# Patient Record
Sex: Male | Born: 1965 | Race: Black or African American | Hispanic: No | Marital: Married | Smoking: Never smoker
Health system: Southern US, Community
[De-identification: ages and names within clinical notes are randomized; demographics above are authoritative.]

## PROBLEM LIST (undated history)

## (undated) DIAGNOSIS — L0291 Cutaneous abscess, unspecified: Secondary | ICD-10-CM

## (undated) DIAGNOSIS — N189 Chronic kidney disease, unspecified: Secondary | ICD-10-CM

## (undated) DIAGNOSIS — E1129 Type 2 diabetes mellitus with other diabetic kidney complication: Secondary | ICD-10-CM

## (undated) DIAGNOSIS — R809 Proteinuria, unspecified: Secondary | ICD-10-CM

## (undated) DIAGNOSIS — I10 Essential (primary) hypertension: Secondary | ICD-10-CM

## (undated) HISTORY — DX: Type 2 diabetes mellitus with other diabetic kidney complication: E11.29

## (undated) HISTORY — DX: Proteinuria, unspecified: R80.9

## (undated) HISTORY — DX: Morbid (severe) obesity due to excess calories: E66.01

## (undated) HISTORY — DX: Cutaneous abscess, unspecified: L02.91

## (undated) HISTORY — DX: Chronic kidney disease, unspecified: N18.9

## (undated) HISTORY — DX: Essential (primary) hypertension: I10

---
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
## 2009-09-05 DIAGNOSIS — L0291 Cutaneous abscess, unspecified: Secondary | ICD-10-CM | POA: Insufficient documentation

## 2009-09-05 HISTORY — DX: Cutaneous abscess, unspecified: L02.91

## 2009-09-05 HISTORY — PX: OTHER SURGICAL HISTORY: SHX169

## 2009-09-30 ENCOUNTER — Inpatient Hospital Stay (HOSPITAL_COMMUNITY): Admission: EM | Admit: 2009-09-30 | Discharge: 2009-10-05 | Payer: Self-pay | Admitting: Internal Medicine

## 2009-09-30 ENCOUNTER — Ambulatory Visit: Payer: Self-pay | Admitting: Internal Medicine

## 2009-09-30 LAB — CONVERTED CEMR LAB
ALT: 28 units/L
AST: 23 units/L
Alkaline Phosphatase: 107 units/L
CO2: 24 meq/L
Creatinine, Ser: 4.52 mg/dL
Potassium: 4.3 meq/L
Sodium: 132 meq/L
Total Bilirubin: 0.4 mg/dL

## 2009-10-01 ENCOUNTER — Encounter: Payer: Self-pay | Admitting: Internal Medicine

## 2009-10-05 ENCOUNTER — Encounter: Payer: Self-pay | Admitting: Internal Medicine

## 2009-10-05 DIAGNOSIS — N189 Chronic kidney disease, unspecified: Secondary | ICD-10-CM

## 2009-10-05 DIAGNOSIS — L03319 Cellulitis of trunk, unspecified: Secondary | ICD-10-CM

## 2009-10-05 DIAGNOSIS — L02219 Cutaneous abscess of trunk, unspecified: Secondary | ICD-10-CM

## 2009-10-05 DIAGNOSIS — I10 Essential (primary) hypertension: Secondary | ICD-10-CM

## 2009-10-05 DIAGNOSIS — J45909 Unspecified asthma, uncomplicated: Secondary | ICD-10-CM | POA: Insufficient documentation

## 2009-10-05 DIAGNOSIS — E119 Type 2 diabetes mellitus without complications: Secondary | ICD-10-CM

## 2009-10-19 ENCOUNTER — Encounter: Payer: Self-pay | Admitting: Internal Medicine

## 2009-10-19 ENCOUNTER — Ambulatory Visit: Payer: Self-pay | Admitting: Internal Medicine

## 2009-10-19 ENCOUNTER — Encounter (INDEPENDENT_AMBULATORY_CARE_PROVIDER_SITE_OTHER): Payer: Self-pay | Admitting: Internal Medicine

## 2009-10-19 DIAGNOSIS — E669 Obesity, unspecified: Secondary | ICD-10-CM | POA: Insufficient documentation

## 2009-10-19 LAB — CONVERTED CEMR LAB
Basophils Absolute: 0.1 10*3/uL (ref 0.0–0.1)
CO2: 24 meq/L (ref 19–32)
Calcium: 9.1 mg/dL (ref 8.4–10.5)
Glucose, Bld: 334 mg/dL — ABNORMAL HIGH (ref 70–99)
Hemoglobin: 12.4 g/dL — ABNORMAL LOW (ref 13.0–17.0)
Lymphs Abs: 2.1 10*3/uL (ref 0.7–4.0)
Neutro Abs: 2.5 10*3/uL (ref 1.7–7.7)
Platelets: 319 10*3/uL (ref 150–400)
Potassium: 4.4 meq/L (ref 3.5–5.3)
RBC: 4.22 M/uL (ref 4.22–5.81)
RDW: 16.5 % — ABNORMAL HIGH (ref 11.5–15.5)
Sodium: 135 meq/L (ref 135–145)
WBC: 5.8 10*3/uL (ref 4.0–10.5)

## 2009-10-25 ENCOUNTER — Ambulatory Visit: Payer: Self-pay | Admitting: Internal Medicine

## 2009-10-25 LAB — CONVERTED CEMR LAB
Blood Glucose, Fingerstick: 292
Blood Glucose, Home Monitor: 1 mg/dL

## 2009-11-01 ENCOUNTER — Telehealth (INDEPENDENT_AMBULATORY_CARE_PROVIDER_SITE_OTHER): Payer: Self-pay | Admitting: *Deleted

## 2010-06-07 NOTE — Assessment & Plan Note (Signed)
Summary: DM TEACHING/CH   Vital Signs:  Patient profile:   45 year old male Weight:      410.8 pounds CBG Result 292 CBG Device ID [facility] Comments CBG today is fasting    Allergies: No Known Drug Allergies   Complete Medication List: 1)  Lantus 100 Unit/ml Soln (Insulin glargine) .... Inject 22 units subcutaneously at bedtime tonight and begin increasing by 2 units each night until fasting blood sugars are < 150mg /dl 2)  Lisinopril 5 Mg Tabs (Lisinopril) .... Take 1 tablet by mouth once a day 3)  Amlodipine Besylate 10 Mg Tabs (Amlodipine besylate) .... Take 1 tablet by mouth once a day 4)  Atenolol 25 Mg Tabs (Atenolol) .... Take 1 tablet by mouth once a day 5)  Ventolin Hfa 108 (90 Base) Mcg/act Aers (Albuterol sulfate) .Marland Kitchen.. 1 puff every 4 hours as needed for shortness of breath  Other Orders: DSMT(Medicare) Individual, 30 Minutes [id number])  Patient Instructions: 1)  get meter at [place] and strips 2)  Begin testing blood sugar every morning 3)  increase lantus by 2 units each night until mornig blood sugar is <150 mg/dl. 4)  More vegetables at meals need to buy some ( meats meat substitutes- like nuts,seeds, eggs, peanutbutter tuna salmon have no carbs) (Carbs increase blood sugar-  so try to limit portions ) 5)  ? Check with Marlana Latus to see if she can help you until insurance covers  Diabetes Self Management Training  Diabetes Type: Type 2 insulin Current smoking Status: never  Vital Signs Todays Weight: 410.8lb  in in-lbs   Assessment Type of Work: Company secretary- from [place]  Potential Barriers  Economic/Supplies  Diabetes Medications:  Comments: tkaing lantus as prescribed. Self testted his own blood sugar today on [employer] meter. Michela Pitcher he is insured, but diabetes is not covered for 1 year because ti is pre-existing. Caught on quiclly to self monitoirng and meal planning. Note high A1C in hospital- CBG very high fasring today- calcualted basal  insulin .25 units/kg =  ~ 46 units /day suggest he begin increasing by 2 units each night and also work on meal planning at same time-  emphasizing low carb foods and increase water.  Long Acting  Insulin Type:Lantus  Bedtime Dose: 20    Monitoring Self monitoring blood glucose 1 time a day Name of Meter  [facility]  Time of Testing  Before Breakfast  Recent Episodes of: Requiring Help from another person  Hyperglycemia : Yes Hypoglycemia: No Severe Hypoglycemia : No     Diabetes Disease Process  Discussed today Define diabetes in simple terms: Demonstrates competencyState own type of diabetes: Demonstrates competencyState diabetes is treated by meal plan-exercise-medication-monitoring-education: Demonstrates competency Medications State name-action-dose-duration-side effects-and time to take medication: Needs review/assistance   State appropriate timing of food related to medication: Demonstrates competency Nutritional Management Identify what foods most often affect blood glucose: No knowledge    Verbalize importance of controlling food portions: No knowledge   State importance of spacing and not omitting meals and snacks: No knowledge   State changes planned for home meals/snacks: Needs review/assistance    Monitoring State purpose and frequency of monitoring BG-ketones-HgbA1C  : No knowledge   Perform glucose monitoring/ketone testing and record results correctly: No knowledge    State target blood glucose and HgbA1C goals: No knowledge    Complications State the causes-signs and symptoms and prevention of Hyperglycemia: Needs review/assistance   Explain proper treatment of hyperglycemia: Needs review/assistance  Exercise Diabetes Management Education Done: 10/25/2009    BEHAVIORAL GOALS INITIAL Utilizing medications if for therapeutic effectiveness: self adjust insulin to decrease blood sugars Monitoring blood glucose levels daily: purchase meter and  begin chekcing blood sugars fasting        Suggested patient meet with Marlana Latus to see if he qualkifies foir assistance while wating for his insuramce to cover his meds,supplies & training.  Diabetes Self Management Support: clinic visits Follow-up:3-4 weeks  Barnabas Harries RD,CDE  October 25, 2009 1:25 PM

## 2010-06-07 NOTE — Miscellaneous (Signed)
Summary: PATIENT CONSENT  PATIENT CONSENT   Imported By: Lacy Duverney 11/02/2009 14:47:50  _____________________________________________________________________  External Attachment:    Type:   Image     Comment:   External Document  Appended Document:

## 2010-06-07 NOTE — Assessment & Plan Note (Signed)
Summary: NEW HFU-PER DR TOBBIA/CFB   Vital Signs:  Patient profile:   45 year old male Height:      70 inches Weight:      411.3 pounds BMI:     59.23 O2 Sat:      96 % on Room air Temp:     97.7 degrees F oral Pulse rate:   76 / minute BP sitting:   140 / 90  (right arm)  Vitals Entered By: Silverio Decamp NT II (October 19, 2009 10:04 AM)  O2 Flow:  Room air CC: HFU Is Patient Diabetic? Yes Did you bring your meter with you today? No Nutritional Status BMI of > 30 = obese  Have you ever been in a relationship where you felt threatened, hurt or afraid?No   Does patient need assistance? Functional Status Self care Ambulation Normal   Primary Care Provider:  None  CC:  HFU.  History of Present Illness: Patient is a 45 year old man with PMH of HTN, DM, CKD (former baseline SCr of 1.6 per hospital d/c summary) and obesity who presents for followup following hospitalization for abdominal wall abscess with MRSA that needed surgical I&D. Patient is completing course of doxycycline and is establishing care in the [facility]. He hasno complaints today, here for only for followup. I&D site healing well. Will schedule appointment with Dr. Philipp Ovens today for followup. Has been taking Lantus, 20 units at bedtime since discharge. Reports that average blood sugar checked with wife's meter is around 200-230 at night. No problems with self-administration. Had been on metformin for about 1 year prior to this past hospitalization. Has not been able to check blood pressure at home. Denies any more fever, chills, abdominal pain, headaches, weakness, nausea, or vomiting.  Preventive Screening-Counseling & Management  Alcohol-Tobacco     Smoking Status: never  Current Medications (verified): 1)  Lantus 100 Unit/ml Soln (Insulin Glargine) .... Inject 20 Units Subcutaneously At Bedtime 2)  Lisinopril 5 Mg Tabs (Lisinopril) .... Take 1 Tablet By Mouth Once A Day 3)  Amlodipine Besylate 10 Mg Tabs  (Amlodipine Besylate) .... Take 1 Tablet By Mouth Once A Day 4)  Atenolol 25 Mg Tabs (Atenolol) .... Take 1 Tablet By Mouth Once A Day 5)  Ventolin Hfa 108 (90 Base) Mcg/act Aers (Albuterol Sulfate) .Marland Kitchen.. 1 Puff Every 4 Hours As Needed For Shortness of Breath  Allergies (verified): No Known Drug Allergies  Past History:  Family History: Last updated: 10/19/2009 Father - asthma, HTN, deceased - natural causes? 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   Head:  normocephalic and atraumatic.   Eyes:  vision grossly intact, pupils equal, and pupils round.   Ears:  no external deformities.   Nose:  no external deformity, no external erythema, and no nasal discharge.   Mouth:  good dentition and pharynx pink and moist.   Neck:  supple, full ROM, and no masses.   Lungs:  normal respiratory effort, normal breath sounds, no crackles,  and no wheezes.   Heart:  normal rate, regular rhythm, no murmur, no gallop, no rub, and no JVD.  Distant sounds given body habitus, but no abnormalities noted on auscultation. Abdomen:  soft, non-tender, and normal bowel sounds. Incision and drainage site midline of lower abdomen without purulent discharge, well-cared for and packed appropriately. No guarding or rigidity. Obese abdomen. Neurologic:  alert & oriented X3, cranial nerves II-XII intact, and gait normal.   Skin:  turgor normal and color normal.   Psych:  Oriented X3, memory intact for recent and remote, normally interactive, good eye contact, not anxious appearing, and not depressed appearing.     Impression & Recommendations:  Problem # 1:  CELLULITIS AND ABSCESS OF TRUNK (ICD-682.2) Will recheck CBC as indicated for hospital f/u. Wound appears to be healing well without purulent discharge. Granulation tissue appears healthy and patient continues to pack and provide wound care as direct by Dr. Philipp Ovens. Has appointment now scheduled for June 20th with Dr. Philipp Ovens.  Orders: T-CBC w/Diff [id number])  Problem # 2:  CHRONIC KIDNEY DISEASE UNSPECIFIED (ICD-585.9) Wil repeat BMET today to evaluate Cr. Patient now off metformin and on Lantus given CKD. Per hospital d/c baseline in past was around 1.6, Cr was trending down at d/c, but never returned to 1.6 prior to discharge. May have to use new value as new baseline for patient.  Orders: T-Basic Metabolic Panel ([id number] Orders: T-Basic Metabolic Panel ([id number]) ... 10/20/2009  Problem # 3:  DIABETES MELLITUS, TYPE II (ICD-250.00) Will update medical record to reflect A1c obtained while hospitalized (12.7). Patient needs to obtain own diabetic testing supplies and would benefit from referral with Barnabas Harries for both DM teaching as well as medical nutrition therapy. Both referrals made. Patient states that he has had eyes checked in the past - he states that he will  make his own appointment and have the ophthalmologist/optometrist send [place] the report. Updated cholesterol studies from hospitalization and patient with direct LDL of 98. Will give patient time to see if lifestyle changes can decrease this, if not, may benefit from lipid lowering therapy and HDL <10, therefore medication such as niacin may be the best option if tolerated. Will recheck lipids at next appointment.  His updated medication list for this problem includes:    Lantus 100 Unit/ml Soln (Insulin glargine) ..... Inject 20 units subcutaneously at bedtime    Lisinopril 5 Mg Tabs (Lisinopril) .Marland Kitchen... Take 1 tablet by mouth once a day  Orders: DME Referral (DME)Future Orders: T-Lipid Profile [id number]) ... 10/20/2009  Problem # 4:  OBESITY (ICD-278.00) Referred for MNT. Discussed BMI goal of <30 and current BMI of 59. Discussed that portion control, exercise and perserverence will be key to success. Appears motivated and would like to meet for nutrition guidance.  Orders: Nutrition Referral (Nutrition)  Problem # 5:  HYPERTENSION, MILD (ICD-401.1) Repeated BP in room at 140/90. Patient appears to be on a  good regimen for his blood pressure, although HCTZ was likely not started while hospitalized secondary to prerenal acute on chronic kidney disease. Will consider adding HCTZ vs. increasing ACEi/beta-blocker if pressures are uncontrolled and kidney function is back to baseline. Will wait to make changes pending BMET results.  His updated medication list for this problem includes:    Lisinopril 5 Mg Tabs (Lisinopril) .Marland Kitchen... Take 1 tablet by mouth once a day    Amlodipine Besylate 10 Mg Tabs (Amlodipine besylate) .Marland Kitchen... Take 1 tablet by mouth once a day    Atenolol 25 Mg Tabs (Atenolol) .Marland Kitchen... Take 1 tablet by mouth once a day  Complete Medication List: 1)  Lantus 100 Unit/ml Soln (Insulin glargine) .... Inject 20 units subcutaneously at bedtime 2)  Lisinopril 5 Mg Tabs (Lisinopril) .... Take 1  tablet by mouth once a day 3)  Amlodipine Besylate 10 Mg Tabs (Amlodipine besylate) .... Take 1 tablet by mouth once a day 4)  Atenolol 25 Mg Tabs (Atenolol) .... Take 1 tablet by mouth once a day 5)  Ventolin Hfa 108 (90 Base) Mcg/act Aers (Albuterol sulfate) .Marland Kitchen.. 1 puff every 4 hours as needed for shortness of breath  Patient Instructions: 1)  Please schedule a follow-up appointment in 2 months. 2)  It is important that you exercise regularly at least 20 minutes 5 times a week. If you develop chest pain, have severe difficulty breathing, or feel very tired , stop exercising immediately and seek medical attention. Consider a lower calorie diet.  3)  Check your blood sugars regularly. If your readings are usually above 250 or below 70 you should contact our office. 4)  It is important that your Diabetic A1c level is checked every 3 months. 5)  See your eye doctor yearly to check for diabetic eye damage. 6)  Check your feet each night for sore areas, calluses or signs of infection. 7)  Check your Blood Pressure regularly. If it is above 150/100 you should make an appointment.  Prevention & Chronic Care Immunizations   Influenza vaccine: Not documented    Tetanus booster: Not documented   Tetanus booster due: 09/06/2019    Pneumococcal vaccine: Not documented  Other Screening   Smoking status: never  (10/19/2009)  Diabetes Mellitus   HgbA1C: 12.7  (10/01/2009)    Eye exam: Not documented    Foot exam: Not documented   High risk foot: Not documented   Foot care education: Not documented    Urine microalbumin/creatinine ratio: 131.4  (10/01/2009)    Diabetes flowsheet reviewed?: Yes   Progress toward A1C goal: Unchanged   Diabetes comments: Will make own Ophtho appt. Patient will have their office send records when he sees them  Lipids   Total Cholesterol: 143  (10/01/2009)   LDL: Not calculated  (10/01/2009)   LDL Direct: 98  (10/01/2009)   HDL: <10  (10/01/2009)    Triglycerides: 150  (10/01/2009)  Hypertension   Last Blood Pressure: 140 / 90  (10/19/2009)   Serum creatinine: 4.52  (09/30/2009)   Serum potassium 4.3  (09/30/2009)    Hypertension flowsheet reviewed?: Yes   Progress toward BP goal: Unchanged  Self-Management Support :    Diabetes self-management support: Education handout, Written self-care plan, Referred for DM self-management training  (10/19/2009)   Diabetes care plan printed   Diabetes education handout printed   Referred.    Hypertension self-management support: Education handout, Written self-care plan  (10/19/2009)   Hypertension self-care plan printed.   Hypertension education  handout printed   Nursing Instructions: Refer for diabetes self-management training (see order)    Process Orders Check Orders Results:     [facility]: ABN not required for this insurance Tests Sent for requisitioning (October 19, 2009 1:48 PM):     10/19/2009: [facility] -- T-CBC w/Diff [id number] (signed)     10/19/2009: [facility] -- T-Basic Metabolic Panel [id number] (signed)     10/20/2009: [facility] -- T-Basic Metabolic Panel 0000000 (signed)     10/20/2009: [facility] -- T-Lipid Profile [phone] (signed)   Diabetes Self Management Training Referral Patient Name: Brandon Lowery Date Of Birth: [birth year] MRN: [id number] Current Diagnosis:  OBESITY (ICD-278.00) CHRONIC KIDNEY DISEASE UNSPECIFIED (ICD-585.9) ASTHMA (ICD-493.90) HYPERTENSION, MILD (ICD-401.1) DIABETES MELLITUS, TYPE II (ICD-250.00) CELLULITIS AND ABSCESS OF TRUNK (ICD-682.2)     Management Training Needs:   Initial Diabetes Self management Training   Insulin Instruction  Monitoring  Initial Medical Nutrition Therapy(3 hours/1st year)

## 2010-06-07 NOTE — Progress Notes (Signed)
Summary: diabetes support/dmr  Phone Note Outgoing Call   Call placed by: Barnabas Harries RD,CDE,  November 01, 2009 9:26 AM Summary of Call: called to follow up on self titrtation of lantus- ? CBGs how much lantus is he injection now? left message for return call at his conenience.

## 2010-07-23 ENCOUNTER — Encounter: Payer: Self-pay | Admitting: Internal Medicine

## 2010-07-23 DIAGNOSIS — L0291 Cutaneous abscess, unspecified: Secondary | ICD-10-CM

## 2010-07-23 DIAGNOSIS — R809 Proteinuria, unspecified: Secondary | ICD-10-CM | POA: Insufficient documentation

## 2010-07-23 DIAGNOSIS — E1129 Type 2 diabetes mellitus with other diabetic kidney complication: Secondary | ICD-10-CM | POA: Insufficient documentation

## 2010-07-23 DIAGNOSIS — J45909 Unspecified asthma, uncomplicated: Secondary | ICD-10-CM | POA: Insufficient documentation

## 2010-07-23 DIAGNOSIS — N189 Chronic kidney disease, unspecified: Secondary | ICD-10-CM

## 2010-07-25 LAB — RENAL FUNCTION PANEL
BUN: 21 mg/dL (ref 6–23)
BUN: 44 mg/dL — ABNORMAL HIGH (ref 6–23)
CO2: 24 mEq/L (ref 19–32)
CO2: 25 mEq/L (ref 19–32)
Calcium: 8 mg/dL — ABNORMAL LOW (ref 8.4–10.5)
Chloride: 102 mEq/L (ref 96–112)
Chloride: 104 mEq/L (ref 96–112)
Creatinine, Ser: 2.38 mg/dL — ABNORMAL HIGH (ref 0.4–1.5)
Creatinine, Ser: 3.22 mg/dL — ABNORMAL HIGH (ref 0.4–1.5)
GFR calc Af Amer: 25 mL/min — ABNORMAL LOW (ref >60)
GFR calc Af Amer: 31 mL/min — ABNORMAL LOW (ref >60)
GFR calc non Af Amer: 26 mL/min — ABNORMAL LOW (ref >60)
Glucose, Bld: 184 mg/dL — ABNORMAL HIGH (ref 70–99)
Glucose, Bld: 187 mg/dL — ABNORMAL HIGH (ref 70–99)
Glucose, Bld: 210 mg/dL — ABNORMAL HIGH (ref 70–99)
Potassium: 4.2 mEq/L (ref 3.5–5.1)
Sodium: 135 mEq/L (ref 135–145)
Sodium: 136 mEq/L (ref 135–145)
Sodium: 137 mEq/L (ref 135–145)

## 2010-07-25 LAB — COMPREHENSIVE METABOLIC PANEL
ALT: 28 U/L (ref 0–53)
Albumin: 2.3 g/dL — ABNORMAL LOW (ref 3.5–5.2)
Alkaline Phosphatase: 107 U/L (ref 39–117)
BUN: 62 mg/dL — ABNORMAL HIGH (ref 6–23)
Calcium: 8.1 mg/dL — ABNORMAL LOW (ref 8.4–10.5)
Chloride: 97 mEq/L (ref 96–112)
Creatinine, Ser: 4.52 mg/dL — ABNORMAL HIGH (ref 0.4–1.5)
GFR calc non Af Amer: 14 mL/min — ABNORMAL LOW (ref >60)
Glucose, Bld: 233 mg/dL — ABNORMAL HIGH (ref 70–99)
Total Bilirubin: 0.4 mg/dL (ref 0.3–1.2)
Total Protein: 6.7 g/dL (ref 6.0–8.3)

## 2010-07-25 LAB — GLUCOSE, CAPILLARY
Glucose-Capillary: 112 mg/dL — ABNORMAL HIGH (ref 70–99)
Glucose-Capillary: 119 mg/dL — ABNORMAL HIGH (ref 70–99)
Glucose-Capillary: 140 mg/dL — ABNORMAL HIGH (ref 70–99)
Glucose-Capillary: 169 mg/dL — ABNORMAL HIGH (ref 70–99)
Glucose-Capillary: 207 mg/dL — ABNORMAL HIGH (ref 70–99)
Glucose-Capillary: 209 mg/dL — ABNORMAL HIGH (ref 70–99)
Glucose-Capillary: 215 mg/dL — ABNORMAL HIGH (ref 70–99)
Glucose-Capillary: 239 mg/dL — ABNORMAL HIGH (ref 70–99)
Glucose-Capillary: 242 mg/dL — ABNORMAL HIGH (ref 70–99)
Glucose-Capillary: 249 mg/dL — ABNORMAL HIGH (ref 70–99)
Glucose-Capillary: 78 mg/dL (ref 70–99)

## 2010-07-25 LAB — CBC
HCT: 33.1 % — ABNORMAL LOW (ref 39.0–52.0)
Hemoglobin: 11 g/dL — ABNORMAL LOW (ref 13.0–17.0)
Hemoglobin: 11.3 g/dL — ABNORMAL LOW (ref 13.0–17.0)
Hemoglobin: 11.4 g/dL — ABNORMAL LOW (ref 13.0–17.0)
MCHC: 34 g/dL (ref 30.0–36.0)
MCV: 86.2 fL (ref 78.0–100.0)
MCV: 87.1 fL (ref 78.0–100.0)
Platelets: 264 10*3/uL (ref 150–400)
RBC: 3.77 MIL/uL — ABNORMAL LOW (ref 4.22–5.81)
RBC: 3.83 MIL/uL — ABNORMAL LOW (ref 4.22–5.81)
RBC: 3.89 MIL/uL — ABNORMAL LOW (ref 4.22–5.81)
RDW: 15.6 % — ABNORMAL HIGH (ref 11.5–15.5)
WBC: 11 10*3/uL — ABNORMAL HIGH (ref 4.0–10.5)
WBC: 16.6 10*3/uL — ABNORMAL HIGH (ref 4.0–10.5)

## 2010-07-25 LAB — BASIC METABOLIC PANEL
BUN: 15 mg/dL (ref 6–23)
CO2: 25 mEq/L (ref 19–32)
Calcium: 8.1 mg/dL — ABNORMAL LOW (ref 8.4–10.5)
Calcium: 8.1 mg/dL — ABNORMAL LOW (ref 8.4–10.5)
Chloride: 108 mEq/L (ref 96–112)
Creatinine, Ser: 2.09 mg/dL — ABNORMAL HIGH (ref 0.4–1.5)
Creatinine, Ser: 3.98 mg/dL — ABNORMAL HIGH (ref 0.4–1.5)
GFR calc Af Amer: 20 mL/min — ABNORMAL LOW (ref >60)
Potassium: 4.1 mEq/L (ref 3.5–5.1)
Sodium: 133 mEq/L — ABNORMAL LOW (ref 135–145)

## 2010-07-25 LAB — URINALYSIS, ROUTINE W REFLEX MICROSCOPIC
Glucose, UA: NEGATIVE mg/dL
Ketones, ur: NEGATIVE mg/dL
Urobilinogen, UA: 0.2 mg/dL (ref 0.0–1.0)

## 2010-07-25 LAB — LIPID PANEL
HDL: <10 mg/dL — ABNORMAL LOW (ref >39)
VLDL: 30 mg/dL (ref 0–40)

## 2010-07-25 LAB — CULTURE, ROUTINE-ABSCESS

## 2010-07-25 LAB — DIFFERENTIAL
Basophils Absolute: 0 10*3/uL (ref 0.0–0.1)
Eosinophils Absolute: 0.4 10*3/uL (ref 0.0–0.7)
Lymphocytes Relative: 9 % — ABNORMAL LOW (ref 12–46)
Monocytes Relative: 10 % (ref 3–12)
Neutro Abs: 10.4 10*3/uL — ABNORMAL HIGH (ref 1.7–7.7)

## 2010-07-25 LAB — ANAEROBIC CULTURE

## 2010-07-25 LAB — C3 COMPLEMENT: C3 Complement: 140 mg/dL (ref 88–201)

## 2010-07-25 LAB — MICROALBUMIN / CREATININE URINE RATIO
Microalb Creat Ratio: 131.4 mg/g — ABNORMAL HIGH (ref 0.0–30.0)
Microalb, Ur: 18.91 mg/dL — ABNORMAL HIGH (ref 0.00–1.89)

## 2010-07-25 LAB — URINE MICROSCOPIC-ADD ON

## 2010-09-06 ENCOUNTER — Emergency Department (HOSPITAL_COMMUNITY): Payer: BC Managed Care – PPO

## 2010-09-06 ENCOUNTER — Emergency Department (HOSPITAL_COMMUNITY)
Admission: EM | Admit: 2010-09-06 | Discharge: 2010-09-06 | Disposition: A | Discharge status: Home / Self Care | Payer: BC Managed Care – PPO | Attending: Emergency Medicine | Admitting: Emergency Medicine

## 2010-09-06 DIAGNOSIS — J40 Bronchitis, not specified as acute or chronic: Secondary | ICD-10-CM | POA: Insufficient documentation

## 2010-09-06 DIAGNOSIS — I10 Essential (primary) hypertension: Secondary | ICD-10-CM | POA: Insufficient documentation

## 2010-09-06 DIAGNOSIS — R0602 Shortness of breath: Secondary | ICD-10-CM | POA: Insufficient documentation

## 2010-09-06 DIAGNOSIS — R0989 Other specified symptoms and signs involving the circulatory and respiratory systems: Secondary | ICD-10-CM | POA: Insufficient documentation

## 2010-09-06 DIAGNOSIS — E669 Obesity, unspecified: Secondary | ICD-10-CM | POA: Insufficient documentation

## 2010-09-06 DIAGNOSIS — Z79899 Other long term (current) drug therapy: Secondary | ICD-10-CM | POA: Insufficient documentation

## 2010-09-06 DIAGNOSIS — R059 Cough, unspecified: Secondary | ICD-10-CM | POA: Insufficient documentation

## 2010-09-06 DIAGNOSIS — Z9889 Other specified postprocedural states: Secondary | ICD-10-CM | POA: Insufficient documentation

## 2010-09-06 DIAGNOSIS — R093 Abnormal sputum: Secondary | ICD-10-CM | POA: Insufficient documentation

## 2010-09-06 DIAGNOSIS — R05 Cough: Secondary | ICD-10-CM | POA: Insufficient documentation

## 2010-09-06 DIAGNOSIS — R0609 Other forms of dyspnea: Secondary | ICD-10-CM | POA: Insufficient documentation

## 2010-09-06 LAB — COMPREHENSIVE METABOLIC PANEL
AST: 42 U/L — ABNORMAL HIGH (ref 0–37)
Albumin: 3.1 g/dL — ABNORMAL LOW (ref 3.5–5.2)
Alkaline Phosphatase: 96 U/L (ref 39–117)
Chloride: 99 mEq/L (ref 96–112)
GFR calc Af Amer: 39 mL/min — ABNORMAL LOW (ref >60)
Potassium: 3.9 mEq/L (ref 3.5–5.1)
Sodium: 132 mEq/L — ABNORMAL LOW (ref 135–145)
Total Bilirubin: 0.5 mg/dL (ref 0.3–1.2)

## 2010-09-06 LAB — DIFFERENTIAL
Basophils Absolute: 0 K/uL (ref 0.0–0.1)
Basophils Relative: 0 % (ref 0–1)
Eosinophils Absolute: 0.2 K/uL (ref 0.0–0.7)
Eosinophils Relative: 4 % (ref 0–5)
Lymphocytes Relative: 25 % (ref 12–46)
Lymphs Abs: 1.6 K/uL (ref 0.7–4.0)
Monocytes Absolute: 0.4 K/uL (ref 0.1–1.0)
Monocytes Relative: 7 % (ref 3–12)
Neutro Abs: 4 K/uL (ref 1.7–7.7)
Neutrophils Relative %: 64 % (ref 43–77)

## 2010-09-06 LAB — BLOOD GAS, ARTERIAL
Acid-base deficit: 1.5 mmol/L (ref 0.0–2.0)
Bicarbonate: 23.3 meq/L (ref 20.0–24.0)
Drawn by: 23534
FIO2: 0.21 %
O2 Content: 21 L/min
O2 Saturation: 93.7 %
TCO2: 15.9 mmol/L (ref 0–100)
pCO2 arterial: 43.3 mmHg (ref 35.0–45.0)
pH, Arterial: 7.349 — ABNORMAL LOW (ref 7.350–7.450)
pO2, Arterial: 65.6 mmHg — ABNORMAL LOW (ref 80.0–100.0)

## 2010-09-06 LAB — D-DIMER, QUANTITATIVE

## 2010-09-06 LAB — CBC
Hemoglobin: 12.3 g/dL — ABNORMAL LOW (ref 13.0–17.0)
RBC: 4.39 MIL/uL (ref 4.22–5.81)

## 2010-09-06 LAB — GLUCOSE, CAPILLARY: Glucose-Capillary: 452 mg/dL — ABNORMAL HIGH (ref 70–99)

## 2010-10-20 ENCOUNTER — Observation Stay (HOSPITAL_COMMUNITY)
Admission: EM | Admit: 2010-10-20 | Discharge: 2010-10-21 | Disposition: A | Discharge status: Home / Self Care | Payer: BC Managed Care – PPO | Attending: Internal Medicine | Admitting: Internal Medicine

## 2010-10-20 ENCOUNTER — Observation Stay (HOSPITAL_COMMUNITY): Payer: BC Managed Care – PPO

## 2010-10-20 ENCOUNTER — Emergency Department (HOSPITAL_COMMUNITY): Payer: BC Managed Care – PPO

## 2010-10-20 DIAGNOSIS — Z79899 Other long term (current) drug therapy: Secondary | ICD-10-CM | POA: Insufficient documentation

## 2010-10-20 DIAGNOSIS — N179 Acute kidney failure, unspecified: Secondary | ICD-10-CM | POA: Insufficient documentation

## 2010-10-20 DIAGNOSIS — R0789 Other chest pain: Principal | ICD-10-CM | POA: Insufficient documentation

## 2010-10-20 DIAGNOSIS — E119 Type 2 diabetes mellitus without complications: Secondary | ICD-10-CM | POA: Insufficient documentation

## 2010-10-20 DIAGNOSIS — I517 Cardiomegaly: Secondary | ICD-10-CM

## 2010-10-20 DIAGNOSIS — R791 Abnormal coagulation profile: Secondary | ICD-10-CM | POA: Insufficient documentation

## 2010-10-20 DIAGNOSIS — N183 Chronic kidney disease, stage 3 unspecified: Secondary | ICD-10-CM | POA: Insufficient documentation

## 2010-10-20 DIAGNOSIS — I129 Hypertensive chronic kidney disease with stage 1 through stage 4 chronic kidney disease, or unspecified chronic kidney disease: Secondary | ICD-10-CM | POA: Insufficient documentation

## 2010-10-20 LAB — CARDIAC PANEL(CRET KIN+CKTOT+MB+TROPI)
CK, MB: 6.8 ng/mL (ref 0.3–4.0)
Relative Index: 1.2 (ref 0.0–2.5)
Total CK: 703 U/L — ABNORMAL HIGH (ref 7–232)
Total CK: 586 U/L — ABNORMAL HIGH (ref 7–232)
Troponin I: <0.3 ng/mL (ref <0.30)
Troponin I: <0.3 ng/mL (ref <0.30)

## 2010-10-20 LAB — GLUCOSE, CAPILLARY
Glucose-Capillary: 192 mg/dL — ABNORMAL HIGH (ref 70–99)
Glucose-Capillary: 172 mg/dL — ABNORMAL HIGH (ref 70–99)

## 2010-10-20 LAB — CK TOTAL AND CKMB (NOT AT ARMC)
CK, MB: 9.9 ng/mL (ref 0.3–4.0)
Relative Index: 1.1 (ref 0.0–2.5)
Relative Index: 1.1 (ref 0.0–2.5)
Total CK: 905 U/L — ABNORMAL HIGH (ref 7–232)
Total CK: 860 U/L — ABNORMAL HIGH (ref 7–232)

## 2010-10-20 LAB — CBC
Hemoglobin: 13.8 g/dL (ref 13.0–17.0)
MCH: 27.9 pg (ref 26.0–34.0)
RBC: 4.95 MIL/uL (ref 4.22–5.81)
WBC: 9.4 10*3/uL (ref 4.0–10.5)

## 2010-10-20 LAB — DIFFERENTIAL
Basophils Relative: 0 % (ref 0–1)
Monocytes Relative: 10 % (ref 3–12)
Neutro Abs: 5.4 10*3/uL (ref 1.7–7.7)
Neutrophils Relative %: 58 % (ref 43–77)

## 2010-10-20 LAB — BASIC METABOLIC PANEL
BUN: 28 mg/dL — ABNORMAL HIGH (ref 6–23)
CO2: 26 mEq/L (ref 19–32)
Chloride: 100 mEq/L (ref 96–112)
Creatinine, Ser: 2.59 mg/dL — ABNORMAL HIGH (ref 0.4–1.5)

## 2010-10-20 LAB — TROPONIN I
Troponin I: <0.3 ng/mL (ref <0.30)
Troponin I: <0.3 ng/mL (ref <0.30)

## 2010-10-20 LAB — D-DIMER, QUANTITATIVE: D-Dimer, Quant: 0.64 ug/mL-FEU — ABNORMAL HIGH (ref 0.00–0.48)

## 2010-10-21 LAB — DIFFERENTIAL
Basophils Absolute: 0 K/uL (ref 0.0–0.1)
Basophils Relative: 1 % (ref 0–1)
Eosinophils Absolute: 0.3 K/uL (ref 0.0–0.7)
Eosinophils Relative: 5 % (ref 0–5)
Lymphocytes Relative: 39 % (ref 12–46)
Lymphs Abs: 2.5 K/uL (ref 0.7–4.0)
Monocytes Absolute: 0.7 K/uL (ref 0.1–1.0)
Monocytes Relative: 11 % (ref 3–12)
Neutro Abs: 2.9 K/uL (ref 1.7–7.7)
Neutrophils Relative %: 45 % (ref 43–77)

## 2010-10-21 LAB — CBC
HCT: 41.2 % (ref 39.0–52.0)
Hemoglobin: 13.2 g/dL (ref 13.0–17.0)
MCH: 28 pg (ref 26.0–34.0)
MCHC: 32 g/dL (ref 30.0–36.0)
MCV: 87.3 fL (ref 78.0–100.0)
Platelets: 317 K/uL (ref 150–400)
RBC: 4.72 MIL/uL (ref 4.22–5.81)
RDW: 14.2 % (ref 11.5–15.5)
WBC: 6.4 K/uL (ref 4.0–10.5)

## 2010-10-21 LAB — LIPID PANEL
Cholesterol: 173 mg/dL (ref 0–200)
HDL: 41 mg/dL
LDL Cholesterol: 117 mg/dL — ABNORMAL HIGH (ref 0–99)
Total CHOL/HDL Ratio: 4.2 ratio
Triglycerides: 74 mg/dL
VLDL: 15 mg/dL (ref 0–40)

## 2010-10-21 LAB — HEPATIC FUNCTION PANEL
Albumin: 3.2 g/dL — ABNORMAL LOW (ref 3.5–5.2)
Alkaline Phosphatase: 87 U/L (ref 39–117)
Bilirubin, Direct: <0.1 mg/dL (ref 0.0–0.3)
Total Bilirubin: 0.2 mg/dL — ABNORMAL LOW (ref 0.3–1.2)

## 2010-10-21 LAB — RENAL FUNCTION PANEL
Albumin: 3.2 g/dL — ABNORMAL LOW (ref 3.5–5.2)
BUN: 32 mg/dL — ABNORMAL HIGH (ref 6–23)
CO2: 27 meq/L (ref 19–32)
Calcium: 9.3 mg/dL (ref 8.4–10.5)
Chloride: 103 meq/L (ref 96–112)
Creatinine, Ser: 2.49 mg/dL — ABNORMAL HIGH (ref 0.50–1.35)
GFR calc Af Amer: 34 mL/min — ABNORMAL LOW
GFR calc non Af Amer: 28 mL/min — ABNORMAL LOW
Glucose, Bld: 167 mg/dL — ABNORMAL HIGH (ref 70–99)
Phosphorus: 5.4 mg/dL — ABNORMAL HIGH (ref 2.3–4.6)
Potassium: 4.4 meq/L (ref 3.5–5.1)
Sodium: 138 meq/L (ref 135–145)

## 2010-10-21 LAB — GLUCOSE, CAPILLARY
Glucose-Capillary: 165 mg/dL — ABNORMAL HIGH (ref 70–99)
Glucose-Capillary: 199 mg/dL — ABNORMAL HIGH (ref 70–99)

## 2010-10-21 LAB — TSH: TSH: 1.966 u[IU]/mL (ref 0.350–4.500)

## 2010-10-23 NOTE — Discharge Summary (Signed)
[name], Lowery NO.:  [id number]  MEDICAL RECORD NO.:  [id number]  LOCATION:  A306                          FACILITY:  [facility]  PHYSICIAN:  Rexene Alberts, M.D.    DATE OF BIRTH:  [birth year]  DATE OF ADMISSION:  10/20/2010 DATE OF DISCHARGE:  06/15/2012LH                              DISCHARGE SUMMARY   DISCHARGE DIAGNOSES: 1. Chest pain.  Myocardial infarction ruled out.  His D-dimer was     elevated, however, the bilateral lower extremity venous Doppler was     negative for deep venous thrombosis.  The patient's weight did not     permit V/Q scan testing and his renal function did not permit a CT     angiogram. 2. Malignant hypertension. 3. Acute renal failure superimposed on stage III chronic kidney     disease.  The patient's creatinine was 2.49 at the time of     discharge. 4. Type 2 diabetes mellitus. 5. Morbid obesity. 6. Grade 1 diastolic dysfunction per 2-D echocardiogram on October 20, 2010.  DISCHARGE MEDICATIONS: 1. Amlodipine 5 mg daily (new medication). 2. Clonidine 0.1 mg 1 tablet daily (new medication). 3. Pepcid 20 mg daily (new medication). 4. Lantus insulin (SoloSTAR pen).  The dose was increased from 15     units to 30 units at bedtime. 5. Atenolol.  The dose was changed from 25 to 50 mg daily. 6. Albuterol inhaler 2 puffs every 4 hours as needed for wheezing and     chest congestion. 7. Amaryl 4 mg daily. 8. Livalo 4 mg daily. 9. WelChol 625 mg 3 tablets twice daily. 10.Stop lisinopril, stop Bydureon, and stop     amlodipine/olmesartan/hydrochlorothiazide. 11.Stop Cephalexin.  DISCHARGE DISPOSITION:  The patient was discharged to home in improved and stable condition on October 21, 2010.  He was advised to follow up with his primary care physician Dr. Brunetta Genera in 1 week.  CONSULTATIONS:  Registered dietitian.  PROCEDURE PERFORMED: 1. Bilateral lower extremity venous ultrasound on October 20, 2010.  The     results revealed no  evidence of bilateral lower extremity deep vein     thrombosis. 2. A 2-D echocardiogram on October 20, 2010.  The results revealed a     suboptimal image.  Left ventricular wall thickness was increased in     the pattern of mild LVH.  Systolic function was normal.  Ejection     fraction was in the range of 60-65%.  Grade 1 diastolic     dysfunction.  Trivial mitral valve regurgitation.  Possible trivial     pericardial effusion.  HISTORY OF PRESENTING ILLNESS:  The patient is a 45 year old man with a past medical history significant for chronic kidney disease, hypertension, and diabetes mellitus, who presented to the emergency department on October 20, 2010 with a chief complaint of chest pain.  In the emergency department, he was noted to be afebrile and hypertensive with a blood pressure of 170/116.  He was oxygenating 93% on room air. His EKG revealed normal sinus rhythm, left axis deviation, nonspecific T- wave abnormalities, and a pulmonary disease pattern.  His heart rate was 96  beats per minute.  His chest x-ray revealed no acute cardiopulmonary disease.  His initial troponin I was within normal limits.  He was admitted for further evaluation and management.  HOSPITAL COURSE: 1. CHEST PAIN.  The patient was started on nitroglycerin paste not     only for chest pain but also for blood pressure control.  The     nitroglycerin did not appear to help his pain but it was left     on to assist with blood pressure control.  In addition to     nitroglycerin paste, he was treated with as-needed morphine.  For     further evaluation, a number of studies were ordered including     cardiac enzymes, D-dimer, TSH, free T4, and hepatic function panel.     His total CK was elevated ranging from 586-905.  His CK-MB was     elevated as well.  However, all of his relative indices were well     within normal limits.  All of his troponin Is were within normal     limits.  His TSH was within normal limits  at 1.96 and his free T4     was within normal limits at 1.2.  His hepatic function panel was     within normal limits.  His D-dimer was slightly elevated at 0.64.     A V/Q scan was ordered.  However, the     technician informed me that the patient's weight was too large for     the V/Q scanner.  A CT angiogram could not be performed as the     patient had acute on chronic renal failure.  Therefore, bilateral     lower extremity venous Doppler was ordered to rule out DVT.     It was negative for DVT.  He was initially started on full-dose     Lovenox.  However, in light of the negative venous ultrasound of     the lower extremities, it was discontinued.  He     was started on Pepcid empirically for possible gastroesophageal     reflux disease.  He was also treated with as-needed oxycodone and     as-needed morphine for possible muscle spasms.  He was chest pain     free at the time of hospital discharge.  The nitroglycerin paste     was discontinued. 2. MALIGNANT HYPERTENSION.  As stated above, the patient's blood     pressure was 170/116 on admission.  He was recently started on a     multidrug medication 2 weeks ago for better control of his     hypertension as prescribed by his primary care physician Dr.     Brunetta Genera.  Because of his renal function, lisinopril, olmesartan     and hydrochlorothiazide were discontinued.  Instead, the dose of     atenolol was increased to 50 mg daily.  He was maintained on     amlodipine.  Clonidine was added at 0.1 mg b.i.d.  His blood     pressure improved significantly.  At the time of hospital     discharge, it was 134/88.  It is likely that the patient has an     element of noncompliance with his medications.  Nevertheless, he     was sent home on a regimen of atenolol, amlodipine, and clonidine. 3. ACUTE RENAL FAILURE superimposed on chronic kidney disease.  During     the patients previous hospitalization in June 2011,  he was     discharged with  a creatinine of 2.0.  The patient appears to be     oblivious to his renal function.  As stated above, both the ACE     inhibitor and the ARB were discontinued.  Hydrochlorothiazide was     discontinued as well.  He was gently started on normal saline for     the entire hospital course.  His renal function did improve     slightly with a BUN of 32 and a creatinine of 2.49 at the time of     discharge.  He was advised to discuss his renal function with his     primary care physician.  I did not refer him to a nephrologist.  I     informed the patient that he may need to be referred to a     nephrologist, however, this will be deferred to his primary care     physician. 4. TYPE 2 DIABETES MELLITUS.  The patient's capillary blood glucose     was modestly elevated.  He was treated with sliding scale NovoLog     and Lantus insulin.  Amaryl was discontinued temporarily     but was restarted upon discharge.  Hemoglobin A1c     was ordered, however, the result was pending at the time of     hospital discharge.  The patient was discharged on a higher dose of     Lantus insulin.  As above, he was advised to resume Amaryl. 5. MORBID OBESITY.  This is a real problem for the patient.  His     weight is well over 450 pounds.  The registered dietitian was     consulted to assist the patient with meal management for diabetes     mellitus and weight loss.  He was receptive.  He was advised to     talk to his primary care physician about possible bariatric     surgery. 6. GRADE 1 DIASTOLIC DYSFUNCTION.  The patient's chest pain was     assessed with a 2-D echocardiogram in addition to the studies     dictated above.  His 2-D echocardiogram revealed preserved LV     function and grade 1 diastolic dysfunction. 7. LEFT UPPER ARM CELLULITIS.  The patient had been started on     cephalexin in the outpatient setting prior to hospital admission.     Upon my evaluation of his arm, the cellulitis had resolved.   He was     continued on cephalexin for 1 more day.  It was discontinued upon     discharge.     Rexene Alberts, M.D.     DF/MEDQ  D:  10/21/2010  T:  10/21/2010  Job:  [id number]  cc:   Lorelee Market Fax: [phone]  Electronically Signed by Rexene Alberts M.D. on 10/23/2010 06:24:18 PM

## 2010-10-23 NOTE — H&P (Signed)
Brandon Lowery, LAMPHEAR NO.:  [id number]  MEDICAL RECORD NO.:  [id number]  LOCATION:  A306                          FACILITY:  [facility]  PHYSICIAN:  Rexene Alberts, M.D.    DATE OF BIRTH:  January 12, 1966  DATE OF ADMISSION:  10/20/2010 DATE OF DISCHARGE:  LH                             HISTORY & PHYSICAL   PRIMARY CARE PHYSICIAN:  Dr. Linton Ham at [facility] in [place], [place].  CHIEF COMPLAINT:  Chest pain.  HISTORY OF PRESENT ILLNESS:  The patient is a 45 year old man with a past medical history significant for hypertension, type 2 diabetes mellitus, and morbid obesity, who presented to the emergency department today with a chief complaint of chest pain.  His chest pain started last night after he drove home from work.  He developed right-sided chest pain which radiated to the substernal area.  At that time, he rated the pain at 10/10 in intensity.  He did not take any medication to help relieve the discomfort.  He had no associated sweating, nausea, or dizziness.  He did have shortness of breath and pleurisy.  He has had no recent cough, fever, or chills.  He did have swelling in his legs a few days ago.  His primary care physician started a new medication for his blood pressure which included a diuretic.  Since starting the  medication, the swelling has subsided.  Currently, he has no complaints of chest pain.  During the initial evaluation in the emergency department, the patient was noted to be afebrile and hypertensive with a blood pressure 170/116. He was oxygenating 93% on room air.  His respiratory rate was 96.  His EKG reveals normal sinus rhythm, left axis deviation, nonspecific T-wave abnormalities, and a pulmonary disease pattern; heart rate is 96 beats per minute.  His chest x-ray reveals no acute cardiopulmonary disease.  His troponin-I is less than 0.30.  However, his total CK is 905, CK-MB is 9.9, but the relative index is  within normal limits at 1.1.  He is being admitted for further evaluation and management.  PAST MEDICAL HISTORY: 1. Morbid obesity. 2. Type 2 diabetes mellitus. 3. Hypertension. 4. History of acute renal failure in June 2011.  The patient's     creatinine was apparently 2.0 prior to discharge at that time.     Therefore, the patient has underlying chronic kidney disease. 5. Hyperlipidemia. 6. Recent cellulitis of the left upper arm, started on treatment with     cephalexin. 7. Status post I and D of abdominal wall abscess in May 2011. 8. Status post hernia repair in the past. 9. Asthma.  MEDICATIONS: 1. Bydureon one injection weekly.  The patient has run out of this     medication which was initially given as a sample. 2. Albuterol inhaler 2 puffs every 4 hours as needed. 3. Amaryl 4 mg daily. 4. WelChol 625 mg 3 tablets b.i.d. 5. Cephalexin 500 mg three times daily.  It was started on October 13, 2010. 6. Amlodipine/olmesartan/hydrochlorothiazide 02/25/24 mg 1 tablet     daily.  This medication was started approximately 2 weeks ago. 7. Livalo 4  mg daily. 8. Lisinopril 5 mg daily. 9. Lantus insulin 15 units nightly. 10.Atenolol 25 mg daily.  ALLERGIES:  No known drug allergies.  SOCIAL HISTORY:  The patient is married.  He lives in [place], [place].  He has one son.  He is employed as a Curator.  He denies tobacco, alcohol, and illicit drug use.  FAMILY HISTORY:  His mother died of breast cancer at 19 years of age. His father died of natural causes at 4 years of age.  REVIEW OF SYSTEMS:  The patient's review of systems is positive for occasional shortness of breath, occasional swelling in his legs, and occasional joint pain.  Otherwise, review of systems is negative.  PHYSICAL EXAMINATION:  GENERAL:  (The patient is currently in his room on the floor).  Temperature 97.4, pulse 81, respiratory rate 20, blood pressure 175/98, and oxygen saturation  95% on 2 liters of oxygen. GENERAL:  The patient is a pleasant, morbidly obese 45 year old [place] man who is currently sitting up in bed in no acute distress. HEENT:  Head is normocephalic and nontraumatic.  Pupils equal, round, reactive to light.  Extraocular muscles are intact.  Conjunctivae are clear.  Sclerae are white.  Tympanic membranes not examined.  Nasal mucosa is dry.  No sinus tenderness.  Oropharynx reveals mildly dry mucous membranes.  No posterior exudates or erythema. NECK:  Supple and obese.  No adenopathy, no thyromegaly, no bruit, no JVD. LUNGS:  Clear to auscultation bilaterally. HEART:  S1 and S2 with no murmurs, rubs, or gallops. ABDOMEN:  Morbidly obese, positive bowel sounds, soft, nontender, nondistended.  No hepatosplenomegaly, no masses palpated. GU AND RECTAL:  Deferred. EXTREMITIES:  No pedal edema and no pretibial edema.  No calf tenderness bilaterally.  Pedal pulses are barely palpable bilaterally.  There is no obvious erythema of the proximal left arm, although there is some mild tenderness over the site for which he had cellulitis.  Range of motion of all of his joints and extremities is within normal limits. NEUROLOGIC:  The patient is alert and oriented x3.  Cranial nerves II through XII are intact.  Strength is 5/5 throughout.  ADMISSION LABORATORIES:  The results of the EKG and chest x-ray were dictated above.  WBC 9.4, hemoglobin 13.8, platelet count 282, troponin- I less than 0.30.  Sodium 137, potassium 3.4, chloride 100, CO2 of 26, glucose 165, BUN 28, creatinine 2.59, calcium 9.5, CK 905, CK-MB 9.9, relative index 1.1.  ASSESSMENT: 1. Chest pain.  The patient certainly has risk factors for coronary     artery disease including hypertension, diabetes mellitus, and     hyperlipidemia.  His total CK is elevated.  However, the relative     index is within normal limits.  His troponin-I is within normal     limits.  His EKG is abnormal  in the sense that it has a pulmonary     strain pattern.  His chest x-ray reveals no acute cardiopulmonary     disease. 2. Malignant hypertension.  The patient's malignant hypertension could     be a potential cause of his chest pain.  He is treated chronically     with lisinopril and atenolol.  Apparently, he was started on     amlodipine/olmesartan/hydrochlorothiazide a couple of weeks ago. 3. Acute renal failure, superimposed on stage III or stage IV chronic     kidney disease.  Last year, the patient was discharged with a     creatinine of 2.0.  Today it is 2.59.  This is either an acute     episode of renal failure or progressive chronic kidney disease.  He     is treated with olmesartan and lisinopril.  He does not appear to     be significantly volume depleted, although there may be an element     of it. 4. Morbid obesity.  The patient was advised to try to lose weight.  He     has not contemplated bariatric surgery. 5. Type 2 diabetes mellitus.  He is treated chronically with Lantus     insulin.  He was given samples of Bydureon a few weeks ago.  He is     now run out.  The patient is also treated with Amaryl.  PLAN: 1. We will add nitroglycerin paste empirically every 6 hours. 2. We will treat his pain as well with as-needed morphine. 3. We will discontinue the ACE inhibitor and the ARB for now.  We will     follow his renal function closely.  We will start gentle IV fluids. 4. We will replete his potassium chloride cautiously. 5. We will attempt blood pressure control without lisinopril and     olmesartan or hydrochlorothiazide.  Atenolol will be increased to     b.i.d.  Norvasc will be started at 5 mg daily.  Clonidine will be     started at 0.1 mg b.i.d. 6. Weight loss counseling. 7. For further evaluation, we will check cardiac enzymes, 2-D     echocardiogram, TSH, free T4, and a D-dimer.  If his D-dimer is     elevated, we will order a V/Q scan.     Rexene Alberts,  M.D.     DF/MEDQ  D:  10/20/2010  T:  10/20/2010  Job:  [id number]  cc:   Lorelee Market [phone]  Electronically Signed by Rexene Alberts M.D. on 10/23/2010 05:54:35 PM

## 2010-12-09 ENCOUNTER — Ambulatory Visit (INDEPENDENT_AMBULATORY_CARE_PROVIDER_SITE_OTHER): Payer: BC Managed Care – PPO | Admitting: Family Medicine

## 2010-12-09 ENCOUNTER — Encounter: Payer: Self-pay | Admitting: Family Medicine

## 2010-12-09 VITALS — BP 158/98 | HR 93 | Resp 18 | Ht 70.5 in | Wt >= 6400 oz

## 2010-12-09 DIAGNOSIS — R0600 Dyspnea, unspecified: Secondary | ICD-10-CM | POA: Insufficient documentation

## 2010-12-09 DIAGNOSIS — E1129 Type 2 diabetes mellitus with other diabetic kidney complication: Secondary | ICD-10-CM

## 2010-12-09 DIAGNOSIS — J45909 Unspecified asthma, uncomplicated: Secondary | ICD-10-CM

## 2010-12-09 DIAGNOSIS — N058 Unspecified nephritic syndrome with other morphologic changes: Secondary | ICD-10-CM

## 2010-12-09 DIAGNOSIS — I10 Essential (primary) hypertension: Secondary | ICD-10-CM

## 2010-12-09 DIAGNOSIS — N189 Chronic kidney disease, unspecified: Secondary | ICD-10-CM

## 2010-12-09 DIAGNOSIS — R0989 Other specified symptoms and signs involving the circulatory and respiratory systems: Secondary | ICD-10-CM

## 2010-12-09 LAB — GLUCOSE, POCT (MANUAL RESULT ENTRY): POC Glucose: 214

## 2010-12-09 MED ORDER — FLUTICASONE PROPIONATE HFA 110 MCG/ACT IN AERO: 1.0000 | INHALATION_SPRAY | Freq: Two times a day (BID) | RESPIRATORY_TRACT | Status: DC

## 2010-12-09 NOTE — Assessment & Plan Note (Signed)
Obtain A1c. Patient will restart Lantus and increase dose to 20 units daily. Continue Amaryl. We will continue to titrate insulin.

## 2010-12-09 NOTE — Assessment & Plan Note (Signed)
Patient is currently near his maximum weight. When his comorbidities are in better control I will send him to surgery for possible gastric bypass surgery.

## 2010-12-09 NOTE — Assessment & Plan Note (Signed)
Based on patient's comorbidities I am sure his baseline creatinine has worsened. At this point we do not have an accurate trend. I will obtain another basic metabolic panel before our next visit. He will also need to be set up with nephrology.

## 2010-12-09 NOTE — Progress Notes (Signed)
  Subjective:    Patient ID: Brandon Lowery, male    DOB: 01/23/1966, 45 y.o.   MRN: [id number]  HPI Previous PCP- Dr. Linton Ham    SOB- feeling more SOB for past couple of weeks, had home oxygen 1.5 years ago, at that time had "fluid on his lungs ", , currently uses 4-5 pillows at home and a fan to help, no chest pain, non smoker, has been using abuterol every 1-2 hours secondary to SOB and wheezing. He denies fever or productive cough. Of note after review of hospital records he has slightly elevated d-dimer however secondary to habitus was unable to have a  VQ scan, secondary to renal insufficiency was unable to have a CT angiogram it was noted that his lower extremity Dopplers were negative for DVT or Baker cyst. He does admit to being fatigued during the day. He also states that his wife states he snores very loudly at night. He is unsure it if he has apnea at bedtime but will like to have a sleep study done. He currently works as a Designer, industrial/product at [employer]. He is able to perform his duties at work but does that time get short of breath and has to sit for a few seconds.    Stage III Kidney disease-- seen by Nephro during hospitlization one year ago. Per medical records from approximately one year ago his baseline creatinine was 1.6. However other physicians have noted and followup visits that his creatinine had trended up to 2.0. At discharge his creatinine was 2.4. He has not seen nephrology as an outpatient. Nephrotoxic agents were discontinued secondary to his kidney disease.  Obesity- he is concerned about his continued weight gain his heaviest weight has been 440 pounds in the past. He would like to pursue gastric bypass when his chronic medical problems are better controlled.  Diabetes- during her recent admission in June of 2012 minutes date patient had an A1c drawn however I cannot find the results of this. His last A1c was 12.7 and -2011. He currently takes his blood sugar 3 times  a day. fasting 140 --  Mid day - 170-180  Bedtime-   185 , because his fasting blood sugars have been 140-150 he has not taken his Lantus the past 3-4 days. He did however continue taking his Amaryl. He has been through diabetes education however he thought that his blood sugars below he denies any symptoms of hypoglycemia. Denies polyuria, polydipsia   Review of Systems  Constitutional: Positive for activity change and fatigue. Negative for fever.  Respiratory: Positive for shortness of breath and wheezing. Negative for cough and chest tightness.   Cardiovascular: Negative for chest pain and palpitations.   - See above  +leg swelling     Objective:   Physical Exam GEN- very winded with minimal exertion. Ambulatory pulse ox was 94-96%, alert and oriented x3, morbidly obese  HEENT-MMM, oropharynx clear  Neck- large pickwickian habitus CVS- RRR, no murmur, HR 80's  RESP- decreased lung volume, increased work of breathing with minimal exertion. No wheezing no rhonchi. No crackles  Ext- 1+ LE edema, pulses equal bilat      Assessment & Plan:

## 2010-12-09 NOTE — Assessment & Plan Note (Signed)
Start maintenance inhaler with Flovent twice a day, albuterol will be continued when necessary

## 2010-12-09 NOTE — Patient Instructions (Addendum)
We will set you up for a sleep study Start the flovent 1 puff twice a day  Use the albuterol every 4 hours as needed For your diabetes- Increase your lantus to 20 units at bedtime  Bring your blood sugar log  Return for a visit 3 weeks.

## 2010-12-09 NOTE — Assessment & Plan Note (Signed)
Dyspnea is likely multifactorial in the setting of asthma morbid obesity and his other chronic medical morbidities. Less likely the differential is pulmonary embolism with negative lower extremity ultrasound and only slightly elevated d-dimer during last hospitalization. There is no sign of infection at this time. I'm sure patient also has a component of obstructive sleep apnea therefore will set her up for sleep study. We'll start him on Flovent as well to help control his asthma symptoms. He was advised on proper use of albuterol. I did discuss getting a CT scan of the chest with the patient however this time he is reluctant to do so.

## 2010-12-09 NOTE — Assessment & Plan Note (Signed)
Blood pressure goal less than 130/80. We have made a few changes today therefore we will discuss increasing his Norvasc at the next visit.

## 2011-01-05 ENCOUNTER — Encounter: Payer: Self-pay | Admitting: Family Medicine

## 2011-01-05 ENCOUNTER — Ambulatory Visit (INDEPENDENT_AMBULATORY_CARE_PROVIDER_SITE_OTHER): Payer: BC Managed Care – PPO | Admitting: Family Medicine

## 2011-01-05 VITALS — BP 140/90 | HR 89 | Resp 22 | Ht 70.5 in | Wt >= 6400 oz

## 2011-01-05 DIAGNOSIS — N058 Unspecified nephritic syndrome with other morphologic changes: Secondary | ICD-10-CM

## 2011-01-05 DIAGNOSIS — I10 Essential (primary) hypertension: Secondary | ICD-10-CM

## 2011-01-05 DIAGNOSIS — E785 Hyperlipidemia, unspecified: Secondary | ICD-10-CM

## 2011-01-05 DIAGNOSIS — E1129 Type 2 diabetes mellitus with other diabetic kidney complication: Secondary | ICD-10-CM

## 2011-01-05 DIAGNOSIS — J45909 Unspecified asthma, uncomplicated: Secondary | ICD-10-CM

## 2011-01-05 LAB — LIPID PANEL
Cholesterol: 174 mg/dL (ref 0–200)
Triglycerides: 77 mg/dL (ref <150)

## 2011-01-05 MED ORDER — AMLODIPINE BESYLATE 10 MG PO TABS: 10.0000 mg | ORAL_TABLET | Freq: Every day | ORAL | Status: DC

## 2011-01-05 NOTE — Progress Notes (Signed)
  Subjective:    Patient ID: Brandon Lowery, male    DOB: 11-16-1965, 45 y.o.   MRN: [id number]  HPI  Diabetes- giving 20 units of lantus- fasting range from 130-150's, PP values which are random 170-230's. He did not get any labwork done   HTN- no difficulty medications, + leg edema, no chest pain, continued SOB   Asthma- he did not start flovent, he was confused on what inhaler he was suppose to use, he was called by some doctor but not sure if it was for the sleep study or not.   Weight- has been trying to watch his diet,has been trying cut back on soda, has increased water intake, try to cut out fast food, has been eating a few more veggies,  occ leg swelling this resolves with elevation , weight up 3lbs   Review of Systems   Per above     Objective:   Physical Exam   GEN- NAD, alert and oriented, morbidly obese    RESP- normal WOB, CTAB, difficult to hear at bases    CVS- RRR, no murmur       Assessment & Plan:

## 2011-01-05 NOTE — Patient Instructions (Addendum)
Increase your lantus to 22 units at bedtime Please get your labs down, come in fasting, do not eat after midnight Check your blood sugars three times a day, fasting ( Should be less than 120), 2 hours after a meal ( less than 180)  Increase your Norvasc to 10mg  daily Take your flovent twice a day  Next visit in 4 weeks, to discuss your labs,

## 2011-01-06 LAB — BASIC METABOLIC PANEL
CO2: 21 mEq/L (ref 19–32)
Calcium: 9 mg/dL (ref 8.4–10.5)
Creat: 2.36 mg/dL — ABNORMAL HIGH (ref 0.50–1.35)
Sodium: 142 mEq/L (ref 135–145)

## 2011-01-06 LAB — HEMOGLOBIN A1C
Hgb A1c MFr Bld: 7.7 % — ABNORMAL HIGH (ref <5.7)
Mean Plasma Glucose: 174 mg/dL — ABNORMAL HIGH (ref <117)

## 2011-01-07 ENCOUNTER — Encounter: Payer: Self-pay | Admitting: Family Medicine

## 2011-01-07 NOTE — Assessment & Plan Note (Signed)
Encouraged use of Flovent for maintanance, discussed proper use of albuterol Sleep study for OSA pending

## 2011-01-07 NOTE — Assessment & Plan Note (Signed)
Will have pt obtain labs, Increase Lantus to 22 units. Goal A1C < 7

## 2011-01-07 NOTE — Assessment & Plan Note (Signed)
Discussed diet and weight loss again with pt. He appears to be trying to make some changes. Weight is up 3lbs

## 2011-01-07 NOTE — Assessment & Plan Note (Signed)
Increase Norvasc to 10mg .  Secondary to renal complications ACEI was stopped in the past

## 2011-01-11 ENCOUNTER — Other Ambulatory Visit: Payer: Self-pay | Admitting: Internal Medicine

## 2011-02-01 ENCOUNTER — Encounter: Payer: Self-pay | Admitting: Family Medicine

## 2011-02-01 ENCOUNTER — Ambulatory Visit (INDEPENDENT_AMBULATORY_CARE_PROVIDER_SITE_OTHER): Payer: BC Managed Care – PPO | Admitting: Family Medicine

## 2011-02-01 VITALS — BP 134/88 | HR 91 | Resp 18 | Ht 70.5 in | Wt >= 6400 oz

## 2011-02-01 DIAGNOSIS — N189 Chronic kidney disease, unspecified: Secondary | ICD-10-CM

## 2011-02-01 DIAGNOSIS — Z23 Encounter for immunization: Secondary | ICD-10-CM

## 2011-02-01 DIAGNOSIS — I10 Essential (primary) hypertension: Secondary | ICD-10-CM

## 2011-02-01 DIAGNOSIS — N058 Unspecified nephritic syndrome with other morphologic changes: Secondary | ICD-10-CM

## 2011-02-01 DIAGNOSIS — E1129 Type 2 diabetes mellitus with other diabetic kidney complication: Secondary | ICD-10-CM

## 2011-02-01 DIAGNOSIS — J45909 Unspecified asthma, uncomplicated: Secondary | ICD-10-CM

## 2011-02-01 MED ORDER — ZOLPIDEM TARTRATE 10 MG PO TABS: 10.0000 mg | ORAL_TABLET | Freq: Every evening | ORAL | Status: AC | PRN

## 2011-02-01 MED ORDER — FLUTICASONE PROPIONATE HFA 110 MCG/ACT IN AERO: 1.0000 | INHALATION_SPRAY | Freq: Two times a day (BID) | RESPIRATORY_TRACT | Status: DC

## 2011-02-01 MED ORDER — INFLUENZA VAC TYPES A & B PF IM SUSP: 0.5000 mL | Freq: Once | INTRAMUSCULAR | Status: DC

## 2011-02-01 MED ORDER — INSULIN GLARGINE 100 UNIT/ML ~~LOC~~ SOLN: SUBCUTANEOUS | Status: DC

## 2011-02-01 MED ORDER — ALBUTEROL SULFATE HFA 108 (90 BASE) MCG/ACT IN AERS: 2.0000 | INHALATION_SPRAY | RESPIRATORY_TRACT | Status: DC | PRN

## 2011-02-01 MED ORDER — ATENOLOL 50 MG PO TABS: 50.0000 mg | ORAL_TABLET | Freq: Every day | ORAL | Status: DC

## 2011-02-01 MED ORDER — GLIMEPIRIDE 4 MG PO TABS: 4.0000 mg | ORAL_TABLET | Freq: Every day | ORAL | Status: AC

## 2011-02-01 MED ORDER — CLONIDINE HCL 0.1 MG PO TABS: 0.1000 mg | ORAL_TABLET | Freq: Every day | ORAL | Status: DC

## 2011-02-01 NOTE — Progress Notes (Signed)
  Subjective:    Patient ID: Brandon Lowery, male    DOB: May 25, 1965, 45 y.o.   MRN: [id number]  HPI  DM- Taking insulin and amaryl and a currently injecting 22 units,- blood sugars have been running high, fasting blood sugars are 140-150, A1C 7.7% on 01/05/11  CRI- Cr. 2.36, history of CRI,   HTN- has not taken blood pressure medications today , has been out of meds for past few days, has been changing his diet  Asthma- does not have the flovent or albuterol, wheezing has improved  Needs cholesterol meds Asked for referral to Podiatry to have nails cut  Labs reviewed Review of Systems   GEN- denies fatigue, fever,,weakness, recent illness HEENT- denies eye drainage, change in vision, CVS- denies chest pain, palpitations RESP- + SOB, cough, wheeze Endo- denies polyuria, polydipsia       Objective:   Physical Exam   GEN- NAD, alert and oriented, morbidly obese, weight down 10 pounds    RESP- normal WOB, CTAB, difficult to hear at bases    CVS- RRR, no murmur    EXT- 1+ edema   Foot exam- Dry skin Normal monofilament, + callus formation, No open lesions, Long thickened nails         Assessment & Plan:

## 2011-02-01 NOTE — Patient Instructions (Addendum)
I will send a referral to Kidney doctor and the sleep study  I will send an eye doctor referral I will send a foot doctor referral Continue your medications. Inject 24 units of insulin once a day Your A1C was 7.7%, your goal is < 7 I will call in your cholesterol medication Try the Ambien for sleep Continue your weight loss program Next visit in 4 months

## 2011-02-02 MED ORDER — PITAVASTATIN CALCIUM 4 MG PO TABS: ORAL_TABLET | ORAL | Status: DC

## 2011-02-02 NOTE — Assessment & Plan Note (Signed)
Will send to Renal to establish patient. History of proteinuria as well, cause likley uncontrolled DM and HTN

## 2011-02-02 NOTE — Assessment & Plan Note (Signed)
Pt congratulated on some weight loss, continue to push this as well as diet habits and any form of exercise he can tolerate

## 2011-02-02 NOTE — Assessment & Plan Note (Signed)
Improved A1C however this may have something to do with worsened renal failure. Will increase Lantus to 24 units based on pt home values. Recheck A1C at next visit. Encouraged pt to bring meter in.  Send to [employer] and [employer]

## 2011-02-02 NOTE — Assessment & Plan Note (Signed)
Refill blood pressure medications

## 2011-02-02 NOTE — Assessment & Plan Note (Signed)
I am not sure why he does not have inhalers, I advised him if his meds are missing at the pharmacy to call our office. I would still like sleep study, it appears he was contacted but did not return call

## 2011-02-16 ENCOUNTER — Inpatient Hospital Stay (HOSPITAL_COMMUNITY)
Admission: EM | Admit: 2011-02-16 | Discharge: 2011-02-23 | DRG: 087 | Disposition: A | Discharge status: Home / Self Care | Payer: BC Managed Care – PPO | Attending: Internal Medicine | Admitting: Internal Medicine

## 2011-02-16 ENCOUNTER — Encounter (HOSPITAL_COMMUNITY): Payer: Self-pay | Admitting: Emergency Medicine

## 2011-02-16 ENCOUNTER — Emergency Department (HOSPITAL_COMMUNITY): Payer: BC Managed Care – PPO

## 2011-02-16 ENCOUNTER — Other Ambulatory Visit: Payer: Self-pay

## 2011-02-16 DIAGNOSIS — E785 Hyperlipidemia, unspecified: Secondary | ICD-10-CM | POA: Diagnosis present

## 2011-02-16 DIAGNOSIS — Z6841 Body Mass Index (BMI) 40.0 and over, adult: Secondary | ICD-10-CM

## 2011-02-16 DIAGNOSIS — IMO0001 Reserved for inherently not codable concepts without codable children: Secondary | ICD-10-CM | POA: Diagnosis present

## 2011-02-16 DIAGNOSIS — I129 Hypertensive chronic kidney disease with stage 1 through stage 4 chronic kidney disease, or unspecified chronic kidney disease: Secondary | ICD-10-CM | POA: Diagnosis present

## 2011-02-16 DIAGNOSIS — N189 Chronic kidney disease, unspecified: Secondary | ICD-10-CM | POA: Diagnosis present

## 2011-02-16 DIAGNOSIS — J45901 Unspecified asthma with (acute) exacerbation: Secondary | ICD-10-CM | POA: Diagnosis present

## 2011-02-16 DIAGNOSIS — I10 Essential (primary) hypertension: Secondary | ICD-10-CM | POA: Diagnosis present

## 2011-02-16 DIAGNOSIS — E1129 Type 2 diabetes mellitus with other diabetic kidney complication: Secondary | ICD-10-CM | POA: Diagnosis present

## 2011-02-16 DIAGNOSIS — J9692 Respiratory failure, unspecified with hypercapnia: Secondary | ICD-10-CM

## 2011-02-16 DIAGNOSIS — J9600 Acute respiratory failure, unspecified whether with hypoxia or hypercapnia: Principal | ICD-10-CM | POA: Diagnosis present

## 2011-02-16 DIAGNOSIS — Z23 Encounter for immunization: Secondary | ICD-10-CM

## 2011-02-16 LAB — BLOOD GAS, ARTERIAL
Acid-base deficit: 1.1 mmol/L (ref 0.0–2.0)
Drawn by: 22223
O2 Content: 2 L/min
Patient temperature: 37
TCO2: 21.5 mmol/L (ref 0–100)
pCO2 arterial: 67.1 mmHg (ref 35.0–45.0)
pCO2 arterial: 46.2 mmHg — ABNORMAL HIGH (ref 35.0–45.0)
pH, Arterial: 7.225 — ABNORMAL LOW (ref 7.350–7.450)
pH, Arterial: 7.334 — ABNORMAL LOW (ref 7.350–7.450)
pO2, Arterial: 66.8 mmHg — ABNORMAL LOW (ref 80.0–100.0)

## 2011-02-16 LAB — GLUCOSE, CAPILLARY: Glucose-Capillary: 246 mg/dL — ABNORMAL HIGH (ref 70–99)

## 2011-02-16 LAB — TROPONIN I: Troponin I: <0.3 ng/mL (ref <0.30)

## 2011-02-16 LAB — CBC
Platelets: 239 10*3/uL (ref 150–400)
RBC: 4.48 MIL/uL (ref 4.22–5.81)
RDW: 14.4 % (ref 11.5–15.5)
WBC: 8.5 10*3/uL (ref 4.0–10.5)

## 2011-02-16 LAB — BASIC METABOLIC PANEL
CO2: 28 mEq/L (ref 19–32)
Chloride: 103 mEq/L (ref 96–112)
GFR calc Af Amer: 36 mL/min — ABNORMAL LOW (ref >90)
Sodium: 138 mEq/L (ref 135–145)

## 2011-02-16 MED ORDER — MOXIFLOXACIN HCL IN NACL 400 MG/250ML IV SOLN: 400.0000 mg | Freq: Once | INTRAVENOUS | Status: DC

## 2011-02-16 MED ORDER — METHYLPREDNISOLONE SODIUM SUCC 125 MG IJ SOLR: 125.0000 mg | Freq: Once | INTRAMUSCULAR | Status: DC

## 2011-02-16 MED ORDER — ACETAMINOPHEN 325 MG PO TABS: 650.0000 mg | ORAL_TABLET | Freq: Four times a day (QID) | ORAL | Status: DC | PRN

## 2011-02-16 MED ORDER — CLONIDINE HCL 0.1 MG PO TABS
0.1000 mg | ORAL_TABLET | Freq: Once | ORAL | Status: AC
  Administered 2011-02-16: 0.1 mg via ORAL
  Filled 2011-02-16: qty 1

## 2011-02-16 MED ORDER — FUROSEMIDE 10 MG/ML IJ SOLN
60.0000 mg | Freq: Once | INTRAMUSCULAR | Status: AC
  Administered 2011-02-16: 60 mg via INTRAVENOUS
  Filled 2011-02-16: qty 6

## 2011-02-16 MED ORDER — METHYLPREDNISOLONE SODIUM SUCC 125 MG IJ SOLR
60.0000 mg | Freq: Three times a day (TID) | INTRAMUSCULAR | Status: DC
  Administered 2011-02-16 – 2011-02-17 (×2): 60 mg via INTRAVENOUS
  Filled 2011-02-16: qty 2

## 2011-02-16 MED ORDER — ALBUTEROL SULFATE (5 MG/ML) 0.5% IN NEBU
2.5000 mg | INHALATION_SOLUTION | Freq: Once | RESPIRATORY_TRACT | Status: AC
  Administered 2011-02-16: 2.5 mg via RESPIRATORY_TRACT
  Filled 2011-02-16: qty 0.5

## 2011-02-16 MED ORDER — ATENOLOL 25 MG PO TABS
50.0000 mg | ORAL_TABLET | Freq: Every day | ORAL | Status: DC
  Administered 2011-02-17 – 2011-02-23 (×7): 50 mg via ORAL
  Filled 2011-02-16 (×7): qty 2

## 2011-02-16 MED ORDER — ALBUTEROL SULFATE (5 MG/ML) 0.5% IN NEBU
5.0000 mg | INHALATION_SOLUTION | Freq: Once | RESPIRATORY_TRACT | Status: AC
  Administered 2011-02-16: 5 mg via RESPIRATORY_TRACT
  Filled 2011-02-16: qty 1

## 2011-02-16 MED ORDER — BIOTENE DRY MOUTH MT LIQD
Freq: Two times a day (BID) | OROMUCOSAL | Status: DC
  Administered 2011-02-16: 1 via OROMUCOSAL
  Administered 2011-02-17 – 2011-02-18 (×3): via OROMUCOSAL
  Administered 2011-02-19: 1 via OROMUCOSAL
  Administered 2011-02-20 – 2011-02-23 (×6): via OROMUCOSAL

## 2011-02-16 MED ORDER — MOXIFLOXACIN HCL IN NACL 400 MG/250ML IV SOLN
400.0000 mg | INTRAVENOUS | Status: DC
  Administered 2011-02-16 – 2011-02-17 (×2): 400 mg via INTRAVENOUS
  Filled 2011-02-16 (×4): qty 250

## 2011-02-16 MED ORDER — SODIUM CHLORIDE 0.9 % IJ SOLN
10.0000 mL | Freq: Two times a day (BID) | INTRAMUSCULAR | Status: DC
  Administered 2011-02-16 – 2011-02-23 (×15): 10 mL
  Filled 2011-02-16 (×3): qty 10
  Filled 2011-02-16: qty 20
  Filled 2011-02-16 (×9): qty 10
  Filled 2011-02-16: qty 30

## 2011-02-16 MED ORDER — SODIUM CHLORIDE 0.9 % IV SOLN
INTRAVENOUS | Status: AC
  Administered 2011-02-16: 21:00:00 via INTRAVENOUS

## 2011-02-16 MED ORDER — PREDNISONE 20 MG PO TABS
60.0000 mg | ORAL_TABLET | Freq: Once | ORAL | Status: AC
  Administered 2011-02-16: 60 mg via ORAL
  Filled 2011-02-16: qty 3

## 2011-02-16 MED ORDER — SODIUM CHLORIDE 0.9 % IJ SOLN
3.0000 mL | Freq: Two times a day (BID) | INTRAMUSCULAR | Status: DC
  Administered 2011-02-18 – 2011-02-21 (×6): 3 mL via INTRAVENOUS
  Filled 2011-02-16 (×7): qty 3

## 2011-02-16 MED ORDER — FAMOTIDINE 20 MG PO TABS
20.0000 mg | ORAL_TABLET | Freq: Two times a day (BID) | ORAL | Status: DC
  Administered 2011-02-16 – 2011-02-23 (×14): 20 mg via ORAL
  Filled 2011-02-16 (×14): qty 1

## 2011-02-16 MED ORDER — CLONIDINE HCL 0.1 MG PO TABS
0.1000 mg | ORAL_TABLET | Freq: Three times a day (TID) | ORAL | Status: DC
  Administered 2011-02-16: 0.1 mg via ORAL
  Filled 2011-02-16: qty 1

## 2011-02-16 MED ORDER — IPRATROPIUM BROMIDE 0.02 % IN SOLN
RESPIRATORY_TRACT | Status: AC
  Administered 2011-02-16: 0.5 mg
  Filled 2011-02-16: qty 2.5

## 2011-02-16 MED ORDER — SODIUM CHLORIDE 0.9 % IJ SOLN
10.0000 mL | INTRAMUSCULAR | Status: DC | PRN
  Administered 2011-02-16 – 2011-02-23 (×4): 10 mL
  Filled 2011-02-16 (×5): qty 10

## 2011-02-16 MED ORDER — IPRATROPIUM BROMIDE 0.02 % IN SOLN
0.5000 mg | Freq: Once | RESPIRATORY_TRACT | Status: AC
  Administered 2011-02-16: 0.5 mg via RESPIRATORY_TRACT
  Filled 2011-02-16: qty 2.5

## 2011-02-16 MED ORDER — ALBUTEROL SULFATE (5 MG/ML) 0.5% IN NEBU
INHALATION_SOLUTION | RESPIRATORY_TRACT | Status: AC
  Administered 2011-02-16: 5 mg
  Filled 2011-02-16: qty 1

## 2011-02-16 MED ORDER — GLIMEPIRIDE 2 MG PO TABS
4.0000 mg | ORAL_TABLET | Freq: Every day | ORAL | Status: DC
  Administered 2011-02-17 – 2011-02-23 (×7): 4 mg via ORAL
  Filled 2011-02-16 (×7): qty 2

## 2011-02-16 MED ORDER — METHYLPREDNISOLONE SODIUM SUCC 125 MG IJ SOLR: 80.0000 mg | Freq: Four times a day (QID) | INTRAMUSCULAR | Status: DC

## 2011-02-16 MED ORDER — ACETAMINOPHEN 650 MG RE SUPP: 650.0000 mg | Freq: Four times a day (QID) | RECTAL | Status: DC | PRN

## 2011-02-16 MED ORDER — ALBUTEROL SULFATE (5 MG/ML) 0.5% IN NEBU: 2.5000 mg | INHALATION_SOLUTION | RESPIRATORY_TRACT | Status: DC | PRN

## 2011-02-16 MED ORDER — METHYLPREDNISOLONE SODIUM SUCC 125 MG IJ SOLR
INTRAMUSCULAR | Status: AC
  Administered 2011-02-16: 60 mg via INTRAVENOUS
  Filled 2011-02-16: qty 2

## 2011-02-16 MED ORDER — INSULIN GLARGINE 100 UNIT/ML ~~LOC~~ SOLN
30.0000 [IU] | SUBCUTANEOUS | Status: DC
  Administered 2011-02-17 – 2011-02-18 (×2): 30 [IU] via SUBCUTANEOUS
  Filled 2011-02-16: qty 3

## 2011-02-16 MED ORDER — ENOXAPARIN SODIUM 40 MG/0.4ML ~~LOC~~ SOLN
40.0000 mg | SUBCUTANEOUS | Status: DC
  Administered 2011-02-16 – 2011-02-21 (×6): 40 mg via SUBCUTANEOUS
  Filled 2011-02-16 (×6): qty 0.4

## 2011-02-16 MED ORDER — MOXIFLOXACIN HCL IN NACL 400 MG/250ML IV SOLN
INTRAVENOUS | Status: AC
  Filled 2011-02-16: qty 250

## 2011-02-16 MED ORDER — HYDRALAZINE HCL 20 MG/ML IJ SOLN
10.0000 mg | Freq: Four times a day (QID) | INTRAMUSCULAR | Status: DC | PRN
  Administered 2011-02-16 – 2011-02-17 (×2): 10 mg via INTRAVENOUS
  Filled 2011-02-16 (×2): qty 1

## 2011-02-16 MED ORDER — PREDNISONE 20 MG PO TABS
20.0000 mg | ORAL_TABLET | Freq: Once | ORAL | Status: AC
  Administered 2011-02-16: 20 mg via ORAL
  Filled 2011-02-16: qty 1

## 2011-02-16 MED ORDER — SODIUM CHLORIDE 0.9 % IV SOLN: 250.0000 mL | INTRAVENOUS | Status: DC

## 2011-02-16 MED ORDER — INSULIN ASPART 100 UNIT/ML ~~LOC~~ SOLN: 0.0000 [IU] | Freq: Three times a day (TID) | SUBCUTANEOUS | Status: DC

## 2011-02-16 MED ORDER — AMLODIPINE BESYLATE 5 MG PO TABS
10.0000 mg | ORAL_TABLET | Freq: Every day | ORAL | Status: DC
  Administered 2011-02-17 – 2011-02-23 (×7): 10 mg via ORAL
  Filled 2011-02-16 (×7): qty 2

## 2011-02-16 MED ORDER — SODIUM CHLORIDE 0.9 % IJ SOLN
3.0000 mL | INTRAMUSCULAR | Status: DC | PRN
  Filled 2011-02-16: qty 3

## 2011-02-16 MED ORDER — HYDROCODONE-ACETAMINOPHEN 5-325 MG PO TABS: 1.0000 | ORAL_TABLET | ORAL | Status: DC | PRN

## 2011-02-16 MED ORDER — CLONIDINE HCL 0.2 MG PO TABS
0.2000 mg | ORAL_TABLET | Freq: Three times a day (TID) | ORAL | Status: DC
  Administered 2011-02-17 – 2011-02-23 (×20): 0.2 mg via ORAL
  Filled 2011-02-16 (×21): qty 1

## 2011-02-16 MED ORDER — ALBUTEROL SULFATE (5 MG/ML) 0.5% IN NEBU
2.5000 mg | INHALATION_SOLUTION | RESPIRATORY_TRACT | Status: AC
  Administered 2011-02-17 (×2): 2.5 mg via RESPIRATORY_TRACT
  Filled 2011-02-16 (×4): qty 0.5

## 2011-02-16 MED ORDER — IPRATROPIUM BROMIDE 0.02 % IN SOLN
0.5000 mg | RESPIRATORY_TRACT | Status: DC | PRN
  Administered 2011-02-17 (×2): 0.5 mg via RESPIRATORY_TRACT
  Filled 2011-02-16 (×2): qty 2.5

## 2011-02-16 NOTE — H&P (Addendum)
PCP:   Vic Blackbird, MD, MD   Chief Complaint:  Shortness of breath  HPI: This is a 45 y/o male with known h/o asthma, who states he started having SOB approximately four days ago. He has been wheezing, he has a mild cough, productive of grey sputum. No blood. He reports no fevers, mild chills. Today he had an episode of nausea and vomiting. He has worsening exertional dyspnea. No chest pains. He does not have a nebulizer and has been using his inhalers with no effect. He has been having more frequent asthma exacerbations in the past few months. He has never been intubated. He decided to wait until he was off from work before presenting to the ER.  Review of Systems: (positives bolded) The patient denies anorexia, fever, weight loss,, vision loss, decreased hearing, hoarseness, chest pain, syncope, dyspnea on exertion, peripheral edema, balance deficits, hemoptysis, abdominal pain, melena, hematochezia, severe indigestion/heartburn, hematuria, incontinence, genital sores, muscle weakness, suspicious skin lesions, transient blindness, difficulty walking, depression, unusual weight change, abnormal bleeding, enlarged lymph nodes, angioedema, and breast masses.  Past Medical History: Past Medical History  Diagnosis Date  . DM (diabetes mellitus), type 2 with renal complications DX: [id number]    Uncontrolled.  . Asthma DX: 2009  . Chronic renal insufficiency     Baseline Cr 1.6-2  . Abscess 09/2009    H/O abdominal wall MRSA abscess. S/P I&D (09/2009)  . HTN (hypertension)   . Morbid obesity   . Proteinuria    Past Surgical History  Procedure Date  . I&d of abdominal wall abscess 09/2009    By Dr. Ninfa Linden  . Hernia repair 1985    Medications: Prior to Admission medications   Medication Sig Start Date End Date Taking? Authorizing Provider  albuterol (VENTOLIN HFA) 108 (90 BASE) MCG/ACT inhaler Inhale 2 puffs into the lungs every 4 (four) hours as needed. 02/01/11  Yes Vic Blackbird, MD    amLODipine (NORVASC) 10 MG tablet Take 1 tablet (10 mg total) by mouth daily. 01/05/11  Yes Vic Blackbird, MD  atenolol (TENORMIN) 50 MG tablet Take 1 tablet (50 mg total) by mouth daily. 02/01/11  Yes Vic Blackbird, MD  cloNIDine (CATAPRES) 0.1 MG tablet Take 1 tablet (0.1 mg total) by mouth daily. 02/01/11  Yes Vic Blackbird, MD  fluticasone (FLOVENT HFA) 110 MCG/ACT inhaler Inhale 1 puff into the lungs 2 (two) times daily. 02/01/11 02/01/12 Yes Vic Blackbird, MD  glimepiride (AMARYL) 4 MG tablet Take 1 tablet (4 mg total) by mouth daily before breakfast. 02/01/11  Yes Vic Blackbird, MD  insulin glargine (LANTUS) 100 UNIT/ML injection Inject 30 Units into the skin every morning. Inject insulin as directed- Solostar pen 02/01/11  Yes Vic Blackbird, MD  Pitavastatin Calcium (LIVALO) 4 MG TABS 1 tab po qhs for cholesterol 02/02/11  Yes Vic Blackbird, MD  zolpidem (AMBIEN) 10 MG tablet Take 1 tablet (10 mg total) by mouth at bedtime as needed for sleep. 02/01/11 03/03/11 Yes Vic Blackbird, MD    Allergies:  No Known Allergies  Social History:  reports that he has never smoked. He does not have any smokeless tobacco history on file. He reports that he does not drink alcohol or use illicit drugs.he lives with family. No home oxygen, no walker, no cane.  Family History: Family History  Problem Relation Age of Onset  . Cancer Mother 20    breast cancer  . Asthma Father   . Hypertension Father     Physical Exam: Filed Vitals:  02/16/11 1656 02/16/11 1732 02/16/11 1810 02/16/11 1915  BP: 179/102 190/105 181/97 208/122  Pulse: 85 82 78 82  Temp:      TempSrc:      Resp: 20 22 30    Height:      Weight:      SpO2: 100% 100% 99% 99%    General:  Alert and oriented times three, well developed and nourished, no acute distress Eyes: PERRLA, pink conjunctiva, scleral icterus ENT: Moist oral mucosa, neck supple, no thyromegaly Lungs: mild wheeze, no crackles, mild use of accessory  muscles Cardiovascular: regular rate and rhythm, no regurgitation, no gallops, no murmurs. No carotid bruits, no JVD Abdomen: soft, positive BS, non-tender, none distended, no organomegaly, not an acute abdomen GU: not examined Neuro: CN II - XII grossly intact, sensation intact Musculoskeletal: strength 5/5 all extremities, no clubbing, cyanosis or trace edema Skin: no rash, no subcutaneous crepitation, no decubitus Psych: appropriate patient   Labs on Admission:   Basename 02/16/11 1328  NA 138  K 4.0  CL 103  CO2 28  GLUCOSE 221*  BUN 30*  CREATININE 2.38*  CALCIUM 8.6  MG --  PHOS --   No results found for this basename: AST:2,ALT:2,ALKPHOS:2,BILITOT:2,PROT:2,ALBUMIN:2 in the last 72 hours No results found for this basename: LIPASE:2,AMYLASE:2 in the last 72 hours  Basename 02/16/11 1328  WBC 8.5  NEUTROABS --  HGB 12.4*  HCT 38.5*  MCV 85.9  PLT 239    Basename 02/16/11 1328  CKTOTAL --  CKMB --  CKMBINDEX --  TROPONINI <0.30   No results found for this basename: TSH,T4TOTAL,FREET3,T3FREE,THYROIDAB in the last 72 hours No results found for this basename: VITAMINB12:2,FOLATE:2,FERRITIN:2,TIBC:2,IRON:2,RETICCTPCT:2 in the last 72 hours  Radiological Exams on Admission: Dg Chest 2 View  02/16/2011  *RADIOLOGY REPORT*  Clinical Data:  Shortness of breath  CHEST - 2 VIEW  Comparison: 10/20/2010  Findings: Enlargement of cardiac silhouette. Pulmonary vascular congestion. Mediastinal contours normal. Bibasilar atelectasis. No gross infiltrate or effusion. No acute osseous findings.  IMPRESSION: Enlargement of cardiac silhouette with pulmonary vascular congestion. Decreased lung volumes with bibasilar atelectasis.  Original Report Authenticated By: Burnetta Sabin, M.D.    Assessment/Plan Present on Admission:  .Asthma exacerbation -admit to ICU -start solumedrol, duoneb, oxygen, sputum culture and antibiotics -continue BiPaP for acidosis, repeat ABG  ordered -monitor Pulmonary edema -lasix -2D echo ordered -repeat CXR in AM .Essential hypertension -currently uncontrolled -increase clonidine -prn BP meds ordered .DM (diabetes mellitus), type 2 with renal complications -stable -resume home meds -ADA diet -monitor for hyperglycemia with steroids .Chronic renal insufficiency -stable,  Creatinine at baseline -monitor Morbid obesity .-BMI >40 Bariatric bed needed .Hyperlipidemia  Code status: full code DVT propylaxis Team 1  Deamonte Sayegh 02/16/2011, 7:54 PM

## 2011-02-16 NOTE — ED Notes (Signed)
Chest heaviness and SOB since Monday. Pt accesory muscles being used to breath in triage. Pt states greyish sputum when coughing.

## 2011-02-16 NOTE — Procedures (Signed)
Central Venous Catheter Insertion Procedure Note NGOZI RECZEK [id number] Oct 28, 1965  Procedure: Insertion of Central Venous Catheter Indications: Drug and/or fluid administration and Frequent blood sampling  Procedure Details Consent: Risks of procedure as well as the alternatives and risks of each were explained to the (patient/caregiver).  Consent for procedure obtained. Time Out: Verified patient identification, verified procedure, site/side was marked, verified correct patient position, special equipment/implants available, medications/allergies/relevent history reviewed, required imaging and test results available.  Performed  Maximum sterile technique was used including antiseptics, gloves, gown, hand hygiene, mask and sheet. Skin prep: Chlorhexidine; local anesthetic administered A antimicrobial bonded/coated triple lumen catheter was placed in the right femoral vein due to multiple attempts and morbid obesity, no other available access using the Seldinger technique.  Evaluation Blood flow good Complications: No apparent complications Patient did tolerate procedure well.   Brandon Lowery A 02/16/2011, 8:08 PM

## 2011-02-16 NOTE — ED Notes (Signed)
CRITICAL VALUE ALERT  Critical value received:  ABG-pH 7.22; pCO2 67; pO2 134; HCO3 26.7  Date of notification:  02/16/11  Time of notification:  1713  Critical value read back: yes    Nurse who received alert:  A. Ouida Sills, RN  MD notified (1st page):  Dr. Rogene Houston  Time of first page:  1715 (notified)  MD notified (2nd page):  Time of second page:  Responding MD:   Time MD responded:

## 2011-02-16 NOTE — ED Provider Notes (Addendum)
History     CSN: [id number] Arrival date & time: 02/16/2011  1:02 PM  Chief Complaint  Patient presents with  . Shortness of Breath  . Cough    (Consider location/radiation/quality/duration/timing/severity/associated sxs/prior treatment) Patient is a 45 y.o. male presenting with wheezing.  Wheezing  The current episode started 3 to 5 days ago. The problem occurs continuously. The problem has been gradually worsening. The problem is severe. The symptoms are relieved by nothing. The symptoms are aggravated by activity. Associated symptoms include chest pressure, sore throat, cough, shortness of breath and wheezing. Pertinent negatives include no chest pain, no fever and no stridor. Rowe becomes gray when coughing. Nothing relieves the cough. Nothing worsens the cough.  Correction patient has a productive cough of gray sputum he does not turn gray when coughing.  Past Medical History  Diagnosis Date  . DM (diabetes mellitus), type 2 with renal complications DX: [id number]    Uncontrolled.  . Asthma DX: 2009  . Chronic renal insufficiency     Baseline Cr 1.6-2  . Abscess 09/2009    H/O abdominal wall MRSA abscess. S/P I&D (09/2009)  . HTN (hypertension)   . Morbid obesity   . Proteinuria     Past Surgical History  Procedure Date  . I&d of abdominal wall abscess 09/2009    By Dr. Ninfa Linden  . Hernia repair 1985    Family History  Problem Relation Age of Onset  . Cancer Mother 57    breast cancer  . Asthma Father   . Hypertension Father     History  Substance Use Topics  . Smoking status: Never Smoker   . Smokeless tobacco: Not on file  . Alcohol Use: No      Review of Systems  Constitutional: Negative for fever.  HENT: Positive for congestion and sore throat.   Eyes: Negative for itching and visual disturbance.  Respiratory: Positive for cough, chest tightness, shortness of breath and wheezing. Negative for stridor.   Cardiovascular: Positive for leg swelling.  Negative for chest pain.  Gastrointestinal: Negative for nausea, vomiting, abdominal pain and diarrhea.  Genitourinary: Negative for dysuria and hematuria.  Musculoskeletal: Negative for back pain.  Neurological: Negative for headaches.  Hematological: Negative for adenopathy.  Psychiatric/Behavioral: Negative for confusion.    Allergies  Review of patient's allergies indicates no known allergies.  Home Medications   Current Outpatient Rx  Name Route Sig Dispense Refill  . ALBUTEROL SULFATE HFA 108 (90 BASE) MCG/ACT IN AERS Inhalation Inhale 2 puffs into the lungs every 4 (four) hours as needed. 1 Inhaler 3  . AMLODIPINE BESYLATE 10 MG PO TABS Oral Take 1 tablet (10 mg total) by mouth daily. 30 tablet 6  . ATENOLOL 50 MG PO TABS Oral Take 1 tablet (50 mg total) by mouth daily. 90 tablet 1  . CLONIDINE HCL 0.1 MG PO TABS Oral Take 1 tablet (0.1 mg total) by mouth daily. 90 tablet 1  . FLUTICASONE PROPIONATE  HFA 110 MCG/ACT IN AERO Inhalation Inhale 1 puff into the lungs 2 (two) times daily. 1 Inhaler 6  . GLIMEPIRIDE 4 MG PO TABS Oral Take 1 tablet (4 mg total) by mouth daily before breakfast. 90 tablet 1  . INSULIN GLARGINE 100 UNIT/ML [place] SOLN  Inject insulin as directed- Solostar pen 3 mL 3  . PITAVASTATIN CALCIUM 4 MG PO TABS  1 tab po qhs for cholesterol 90 tablet 1  . ZOLPIDEM TARTRATE 10 MG PO TABS Oral Take 1 tablet (10 mg  total) by mouth at bedtime as needed for sleep. 30 tablet 2    BP 175/101  Pulse 89  Temp(Src) 98.7 F (37.1 C) (Oral)  Resp 40  Ht 5' 7.5" (1.715 m)  Wt 414 lb (187.789 kg)  BMI 63.88 kg/m2  SpO2 92%  Physical Exam  Nursing note and vitals reviewed. Constitutional: He is oriented to person, place, and time. He appears well-developed and well-nourished. He appears distressed.  HENT:  Head: Normocephalic and atraumatic.  Mouth/Throat: Oropharynx is clear and moist.  Eyes: Conjunctivae and EOM are normal. Pupils are equal, round, and reactive to  light.  Neck: Normal range of motion. Neck supple. No JVD present.  Cardiovascular: Normal rate, regular rhythm and normal heart sounds.   No murmur heard. Pulmonary/Chest: No stridor. He is in respiratory distress. He has wheezes. He exhibits no tenderness.  Abdominal: Soft. Bowel sounds are normal. There is no tenderness.  Musculoskeletal: Normal range of motion. He exhibits edema.  Lymphadenopathy:    He has no cervical adenopathy.  Neurological: He is alert and oriented to person, place, and time. No cranial nerve deficit. He exhibits normal muscle tone. Coordination normal.  Skin: Skin is warm and dry. No rash noted. He is not diaphoretic.    ED Course  CRITICAL CARE Performed by: Mervin Kung. Authorized by: Mervin Kung. Total critical care time: 30 minutes Critical care time was exclusive of separately billable procedures and treating other patients. Critical care was necessary to treat or prevent imminent or life-threatening deterioration of the following conditions: respiratory failure. Critical care was time spent personally by me on the following activities: discussions with consultants, evaluation of patient's response to treatment, examination of patient, obtaining history from patient or surrogate, ordering and review of laboratory studies, ordering and performing treatments and interventions, ordering and review of radiographic studies, pulse oximetry and re-evaluation of patient's condition. Comments: BIPAP MANAGEMENT. PLACED ON BIPAP FOR HYPERCAPNEA AND ACIDIOSIS. SUSPECT BRONCHOSPASM ASTHMA AS ETIOLOGY BUT CAN NOT COMPLETELY RULE OUT COMPONENT OF CHF. TN NEGATIVE SO NOT CW MI. IN ED RX WIT ALBUTEROL ATROVENT X2 AND ALBUTEROL X1 AND PREDNISONE 80 MG PO EARLY ON. DURING THIRD NEB BECAME SOMULENT, ABG OBTAINED AND STARTED BIPAP ABG CONFIRMED ELEVATED PCO2.    (including critical care time)   Date: 02/16/2011  Rate: 89  Rhythm: normal sinus rhythm  QRS Axis:  normal  Intervals: normal  ST/T Wave abnormalities: nonspecific ST/T changes  Conduction Disutrbances:none  Narrative Interpretation:   Old EKG Reviewed: unchanged FROM 10/21/10     Labs Reviewed  CBC  BASIC METABOLIC PANEL   No results found.   No diagnosis found.    MDM   SEE critical care note. Negative troponin rules out acute coronary event. Chest x-ray negative for pneumonia or obvious congestive heart failure. Patient discussed with triad admitting team. Patient will go to step down unit on BiPAP. Temporary admitting orders completed.         Mervin Kung, MD 02/16/11 1827  Mervin Kung, MD 02/26/11 1630

## 2011-02-16 NOTE — ED Notes (Signed)
Report given to nicole in icu. nicole to ask to ask Dr. Arnoldo Morale to put in line for pt per edp order. Admitting md at bedside. nad noted.

## 2011-02-17 ENCOUNTER — Inpatient Hospital Stay (HOSPITAL_COMMUNITY): Payer: BC Managed Care – PPO

## 2011-02-17 DIAGNOSIS — J9600 Acute respiratory failure, unspecified whether with hypoxia or hypercapnia: Principal | ICD-10-CM | POA: Diagnosis present

## 2011-02-17 DIAGNOSIS — I517 Cardiomegaly: Secondary | ICD-10-CM

## 2011-02-17 LAB — GLUCOSE, CAPILLARY
Glucose-Capillary: 373 mg/dL — ABNORMAL HIGH (ref 70–99)
Glucose-Capillary: 306 mg/dL — ABNORMAL HIGH (ref 70–99)

## 2011-02-17 LAB — BLOOD GAS, ARTERIAL
Acid-base deficit: 2.3 mmol/L — ABNORMAL HIGH (ref 0.0–2.0)
Delivery systems: POSITIVE
Drawn by: 22223
FIO2: 45 %
Inspiratory PAP: 12
O2 Saturation: 97.4 %
TCO2: 22 mmol/L (ref 0–100)

## 2011-02-17 LAB — BASIC METABOLIC PANEL
Chloride: 101 mEq/L (ref 96–112)
GFR calc Af Amer: 30 mL/min — ABNORMAL LOW (ref >90)
GFR calc non Af Amer: 25 mL/min — ABNORMAL LOW (ref >90)
Potassium: 4.4 mEq/L (ref 3.5–5.1)
Sodium: 136 mEq/L (ref 135–145)

## 2011-02-17 LAB — CBC
HCT: 41 % (ref 39.0–52.0)
Hemoglobin: 13 g/dL (ref 13.0–17.0)
RBC: 4.72 MIL/uL (ref 4.22–5.81)
WBC: 10 10*3/uL (ref 4.0–10.5)

## 2011-02-17 MED ORDER — INSULIN ASPART 100 UNIT/ML ~~LOC~~ SOLN
0.0000 [IU] | Freq: Three times a day (TID) | SUBCUTANEOUS | Status: DC
  Administered 2011-02-17: 9 [IU] via SUBCUTANEOUS
  Administered 2011-02-17: 7 [IU] via SUBCUTANEOUS
  Administered 2011-02-18 (×2): 5 [IU] via SUBCUTANEOUS
  Administered 2011-02-18: 3 [IU] via SUBCUTANEOUS
  Administered 2011-02-19 (×3): 5 [IU] via SUBCUTANEOUS
  Administered 2011-02-20: 2 [IU] via SUBCUTANEOUS
  Administered 2011-02-20: 3 [IU] via SUBCUTANEOUS
  Administered 2011-02-20: 5 [IU] via SUBCUTANEOUS
  Administered 2011-02-21: 2 [IU] via SUBCUTANEOUS
  Administered 2011-02-21 – 2011-02-22 (×2): 1 [IU] via SUBCUTANEOUS
  Administered 2011-02-22: 3 [IU] via SUBCUTANEOUS
  Administered 2011-02-23: 2 [IU] via SUBCUTANEOUS
  Filled 2011-02-17: qty 3

## 2011-02-17 MED ORDER — METHYLPREDNISOLONE SODIUM SUCC 125 MG IJ SOLR
80.0000 mg | Freq: Four times a day (QID) | INTRAMUSCULAR | Status: DC
  Administered 2011-02-17 – 2011-02-18 (×4): 81.25 mg via INTRAVENOUS
  Filled 2011-02-17 (×4): qty 2

## 2011-02-17 MED ORDER — ALBUTEROL SULFATE (5 MG/ML) 0.5% IN NEBU
2.5000 mg | INHALATION_SOLUTION | RESPIRATORY_TRACT | Status: DC | PRN
  Administered 2011-02-17 – 2011-02-20 (×5): 2.5 mg via RESPIRATORY_TRACT
  Filled 2011-02-17 (×4): qty 0.5

## 2011-02-17 MED ORDER — IPRATROPIUM BROMIDE 0.02 % IN SOLN
0.5000 mg | RESPIRATORY_TRACT | Status: DC | PRN
  Administered 2011-02-17 – 2011-02-20 (×6): 0.5 mg via RESPIRATORY_TRACT
  Filled 2011-02-17 (×4): qty 2.5

## 2011-02-17 MED ORDER — IPRATROPIUM BROMIDE 0.02 % IN SOLN
0.5000 mg | RESPIRATORY_TRACT | Status: AC
  Administered 2011-02-17: 0.5 mg via RESPIRATORY_TRACT
  Filled 2011-02-17 (×2): qty 2.5

## 2011-02-17 MED ORDER — HYDRALAZINE HCL 25 MG PO TABS
25.0000 mg | ORAL_TABLET | Freq: Three times a day (TID) | ORAL | Status: DC
  Administered 2011-02-17 – 2011-02-23 (×19): 25 mg via ORAL
  Filled 2011-02-17 (×20): qty 1

## 2011-02-17 MED ORDER — ALBUTEROL SULFATE (5 MG/ML) 0.5% IN NEBU
2.5000 mg | INHALATION_SOLUTION | RESPIRATORY_TRACT | Status: AC
  Administered 2011-02-17: 2.5 mg via RESPIRATORY_TRACT

## 2011-02-17 MED ORDER — INSULIN ASPART 100 UNIT/ML ~~LOC~~ SOLN
0.0000 [IU] | Freq: Every day | SUBCUTANEOUS | Status: DC
  Administered 2011-02-17: 4 [IU] via SUBCUTANEOUS

## 2011-02-17 NOTE — Progress Notes (Signed)
Subjective: Breathing a little better this morning has been off BiPAP after waking up.  Objective: Vital signs in last 24 hours: Temp:  [97.7 F (36.5 C)-98.7 F (37.1 C)] 97.7 F (36.5 C) (10/12 0400) Pulse Rate:  [78-96] 85  (10/12 0500) Resp:  [7-40] 26  (10/12 0605) BP: (155-208)/(89-132) 169/114 mmHg (10/12 0500) SpO2:  [92 %-100 %] 94 % (10/12 0500) FiO2 (%):  [45 %] 45 % (10/12 0433) Weight:  [187.789 kg (414 lb)-187.79 kg (414 lb)] 414 lb (187.789 kg) (10/11 2157) Weight change:  Last BM Date: 02/16/11  Intake/Output from previous day: 10/11 0701 - 10/12 0700 In: 715.8 [I.V.:465.8; IV Piggyback:250] Out: 500 [Urine:500]     Physical Exam: General: Morbidly obese gentleman, Alert, awake, oriented x3, in no acute distress. HEENT: No bruits, no goiter. Heart: Regular rate and rhythm, without murmurs, rubs, gallops. Lungs: Decreased air movement bilaterally no expiratory wheezes audible. Abdomen: Soft, nontender, nondistended, positive bowel sounds. Extremities: 1+ pitting edema edema bilaterally Neuro: Grossly intact, nonfocal.    Lab Results: Basic Metabolic Panel:  Basename 02/17/11 0440 02/16/11 1328  NA 136 138  K 4.4 4.0  CL 101 103  CO2 26 28  GLUCOSE 318* 221*  BUN 37* 30*  CREATININE 2.82* 2.38*  CALCIUM 8.5 8.6  MG -- --  PHOS -- --   Liver Function Tests: No results found for this basename: AST:2,ALT:2,ALKPHOS:2,BILITOT:2,PROT:2,ALBUMIN:2 in the last 72 hours No results found for this basename: LIPASE:2,AMYLASE:2 in the last 72 hours No results found for this basename: AMMONIA:2 in the last 72 hours CBC:  Basename 02/17/11 0440 02/16/11 1328  WBC 10.0 8.5  NEUTROABS -- --  HGB 13.0 12.4*  HCT 41.0 38.5*  MCV 86.9 85.9  PLT 320 239   Cardiac Enzymes:  Basename 02/16/11 1328  CKTOTAL --  CKMB --  CKMBINDEX --  TROPONINI <0.30   BNP:  Basename 02/17/11 0440 02/16/11 1328  POCBNP 1933.0* 1887.0*   D-Dimer: No results found  for this basename: DDIMER:2 in the last 72 hours CBG:  Basename 02/16/11 2255  GLUCAP 246*   Hemoglobin A1C: No results found for this basename: HGBA1C in the last 72 hours Fasting Lipid Panel: No results found for this basename: CHOL,HDL,LDLCALC,TRIG,CHOLHDL,LDLDIRECT in the last 72 hours Thyroid Function Tests: No results found for this basename: TSH,T4TOTAL,FREET4,T3FREE,THYROIDAB in the last 72 hours Anemia Panel: No results found for this basename: VITAMINB12,FOLATE,FERRITIN,TIBC,IRON,RETICCTPCT in the last 72 hours Urine Drug Screen:  Alcohol Level: No results found for this basename: ETH:2 in the last 72 hours Urinalysis:  Misc. Labs:  Recent Results (from the past 240 hour(s))  MRSA PCR SCREENING     Status: Normal   Collection Time   02/16/11 10:14 PM      Component Value Range Status Comment   MRSA by PCR NEGATIVE  NEGATIVE  Final     Studies/Results: Dg Chest 2 View  02/16/2011  *RADIOLOGY REPORT*  Clinical Data:  Shortness of breath  CHEST - 2 VIEW  Comparison: 10/20/2010  Findings: Enlargement of cardiac silhouette. Pulmonary vascular congestion. Mediastinal contours normal. Bibasilar atelectasis. No gross infiltrate or effusion. No acute osseous findings.  IMPRESSION: Enlargement of cardiac silhouette with pulmonary vascular congestion. Decreased lung volumes with bibasilar atelectasis.  Original Report Authenticated By: Burnetta Sabin, M.D.    Medications: Scheduled Meds:   . sodium chloride   Intravenous STAT  . albuterol  2.5 mg Nebulization Once  . albuterol  2.5 mg Nebulization Q4H  . albuterol  5 mg  Nebulization Once  . albuterol      . amLODipine  10 mg Oral Daily  . antiseptic oral rinse   Mouth Rinse BID  . atenolol  50 mg Oral Daily  . cloNIDine  0.1 mg Oral Once  . cloNIDine  0.2 mg Oral TID  . enoxaparin (LOVENOX) injection  40 mg Subcutaneous Q24H  . famotidine  20 mg Oral BID  . furosemide  60 mg Intravenous Once  . glimepiride  4 mg Oral  QAC breakfast  . insulin aspart  0-15 Units Subcutaneous TID WC  . insulin glargine  30 Units Subcutaneous QAM  . ipratropium      . ipratropium  0.5 mg Nebulization Once  . methylPREDNISolone sodium succinate  60 mg Intravenous Q8H  . moxifloxacin  400 mg Intravenous Q24H  . predniSONE  20 mg Oral Once  . predniSONE  60 mg Oral Once  . sodium chloride  10 mL Intracatheter Q12H  . sodium chloride  3 mL Intravenous Q12H  . DISCONTD: cloNIDine  0.1 mg Oral TID  . DISCONTD: methylPREDNISolone sodium succinate  125 mg Intravenous Once  . DISCONTD: methylPREDNISolone sodium succinate  81.25 mg Intravenous Q6H  . DISCONTD: moxifloxacin  400 mg Intravenous Once   Continuous Infusions:   . sodium chloride     PRN Meds:.acetaminophen, acetaminophen, albuterol, hydrALAZINE, HYDROcodone-acetaminophen, ipratropium, sodium chloride, sodium chloride  Assessment/Plan:  Principal Problem:  *Acute respiratory failure: Secondary to asthma exacerbation, continue P.R.N. albuterol and Atrovent nebulizers also add scheduled albuterol and Atrovent nebs every 6 hours  Continue IV Solu-Medrol, moxifloxacin, use BiPAP at night and for any respiratory decompensation  Also check 2-D echo; to a certain degree of pulmonary hypertension, evaluate for diastolic CHF and ejection fraction.  Cut down his IV fluids to [facility]. It is highly likely that he has obstructive sleep apnea as well, he scheduled for a sleep study at the end of the month Active Problems:  Asthma exacerbation  Essential hypertension: Continue  home medications   DM (diabetes mellitus): Continue his Lantus and sliding scale   Chronic renal insufficiency: Stable   Morbid obesity; lifestyle modification   Hyperlipidemia    LOS: 1 day   Nava Song 02/17/2011, 7:59 AM

## 2011-02-17 NOTE — Progress Notes (Signed)
*  PRELIMINARY RESULTS* Echocardiogram 2D Echocardiogram has been performed.  Brandon Lowery 02/17/2011, 10:31 AM

## 2011-02-18 LAB — GLUCOSE, CAPILLARY
Glucose-Capillary: 211 mg/dL — ABNORMAL HIGH (ref 70–99)
Glucose-Capillary: 285 mg/dL — ABNORMAL HIGH (ref 70–99)
Glucose-Capillary: 309 mg/dL — ABNORMAL HIGH (ref 70–99)

## 2011-02-18 MED ORDER — ZOLPIDEM TARTRATE 5 MG PO TABS
10.0000 mg | ORAL_TABLET | Freq: Every evening | ORAL | Status: DC | PRN
  Administered 2011-02-18 – 2011-02-22 (×5): 10 mg via ORAL
  Filled 2011-02-18: qty 2
  Filled 2011-02-18: qty 1
  Filled 2011-02-18 (×4): qty 2

## 2011-02-18 MED ORDER — MOXIFLOXACIN HCL 400 MG PO TABS
400.0000 mg | ORAL_TABLET | Freq: Every day | ORAL | Status: DC
  Administered 2011-02-18 – 2011-02-22 (×5): 400 mg via ORAL
  Filled 2011-02-18 (×5): qty 1

## 2011-02-18 MED ORDER — INSULIN GLARGINE 100 UNIT/ML ~~LOC~~ SOLN
35.0000 [IU] | SUBCUTANEOUS | Status: DC
  Administered 2011-02-19 – 2011-02-23 (×5): 35 [IU] via SUBCUTANEOUS

## 2011-02-18 MED ORDER — METHYLPREDNISOLONE SODIUM SUCC 125 MG IJ SOLR
60.0000 mg | Freq: Four times a day (QID) | INTRAMUSCULAR | Status: DC
  Administered 2011-02-18 – 2011-02-19 (×4): 60 mg via INTRAVENOUS
  Filled 2011-02-18 (×4): qty 2

## 2011-02-18 MED ORDER — INFLUENZA VAC TYPES A & B PF IM SUSP: 0.5000 mL | Freq: Once | INTRAMUSCULAR | Status: DC

## 2011-02-18 NOTE — Progress Notes (Signed)
Pt transferred to [id number]. Report given to Catha Brow., RN, who denied any questions/concerns; pt and family aware of transfer. Pt stable at time of transfer.

## 2011-02-18 NOTE — Progress Notes (Signed)
Subjective: Breathing is better today, much improved from 2 days ago.  Objective: Vital signs in last 24 hours: Temp:  [97.5 F (36.4 C)-98.3 F (36.8 C)] 97.8 F (36.6 C) (10/13 0400) Pulse Rate:  [68-88] 70  (10/13 0700) Resp:  [16-33] 16  (10/13 0700) BP: (138-194)/(78-124) 151/97 mmHg (10/13 0700) SpO2:  [91 %-98 %] 95 % (10/13 0700) Weight change:  Last BM Date: 02/16/11  Intake/Output from previous day: 10/12 0701 - 10/13 0700 In: 1680 [P.O.:1680] Out: -      Physical Exam: General: Morbidly obese male, Alert, awake, oriented x3, in no acute distress, not using accessory muscles of respiration HEENT: No bruits, oral mucosa dry and moist. Heart: Regular rate and rhythm, without murmurs, rubs, gallops. Lungs: Improved air movement bilaterally. Abdomen: Soft, nontender, nondistended, positive bowel sounds. Extremities: No clubbing cyanosis or edema with positive pedal pulses. Neuro: Grossly intact, nonfocal.    Lab Results: Basic Metabolic Panel:  Basename 02/17/11 0440 02/16/11 1328  NA 136 138  K 4.4 4.0  CL 101 103  CO2 26 28  GLUCOSE 318* 221*  BUN 37* 30*  CREATININE 2.82* 2.38*  CALCIUM 8.5 8.6  MG -- --  PHOS -- --   Liver Function Tests: No results found for this basename: AST:2,ALT:2,ALKPHOS:2,BILITOT:2,PROT:2,ALBUMIN:2 in the last 72 hours No results found for this basename: LIPASE:2,AMYLASE:2 in the last 72 hours No results found for this basename: AMMONIA:2 in the last 72 hours CBC:  Basename 02/17/11 0440 02/16/11 1328  WBC 10.0 8.5  NEUTROABS -- --  HGB 13.0 12.4*  HCT 41.0 38.5*  MCV 86.9 85.9  PLT 320 239   Cardiac Enzymes:  Basename 02/16/11 1328  CKTOTAL --  CKMB --  CKMBINDEX --  TROPONINI <0.30   BNP:  Basename 02/17/11 0440 02/16/11 1328  POCBNP 1933.0* 1887.0*   D-Dimer: No results found for this basename: DDIMER:2 in the last 72 hours CBG:  Basename 02/18/11 0642 02/17/11 2120 02/17/11 1620 02/17/11 1145  02/17/11 0733 02/16/11 2255  GLUCAP 179* 306* 373* 320* 255* 246*   Hemoglobin A1C: No results found for this basename: HGBA1C in the last 72 hours Fasting Lipid Panel: No results found for this basename: CHOL,HDL,LDLCALC,TRIG,CHOLHDL,LDLDIRECT in the last 72 hours Thyroid Function Tests: No results found for this basename: TSH,T4TOTAL,FREET4,T3FREE,THYROIDAB in the last 72 hours Anemia Panel: No results found for this basename: VITAMINB12,FOLATE,FERRITIN,TIBC,IRON,RETICCTPCT in the last 72 hours Urine Drug Screen:  Alcohol Level: No results found for this basename: ETH:2 in the last 72 hours Urinalysis:  Misc. Labs:  Recent Results (from the past 240 hour(s))  MRSA PCR SCREENING     Status: Normal   Collection Time   02/16/11 10:14 PM      Component Value Range Status Comment   MRSA by PCR NEGATIVE  NEGATIVE  Final     Studies/Results: Dg Chest 2 View  02/16/2011  *RADIOLOGY REPORT*  Clinical Data:  Shortness of breath  CHEST - 2 VIEW  Comparison: 10/20/2010  Findings: Enlargement of cardiac silhouette. Pulmonary vascular congestion. Mediastinal contours normal. Bibasilar atelectasis. No gross infiltrate or effusion. No acute osseous findings.  IMPRESSION: Enlargement of cardiac silhouette with pulmonary vascular congestion. Decreased lung volumes with bibasilar atelectasis.  Original Report Authenticated By: Burnetta Sabin, M.D.   Dg Chest Portable 1 View Sob  02/17/2011  *RADIOLOGY REPORT*  Clinical Data: Shortness of breath  PORTABLE CHEST - 1 VIEW  Comparison:   the previous day's study  Findings: Low volumes with patchy bibasilar atelectasis or  mild infiltrates, slightly improved since previous exam.  Mild cardiomegaly, probably emphasized by low volumes and technique.  No definite effusion.  IMPRESSION:  Some interval improvement in bibasilar infiltrates or edema.  Original Report Authenticated By: Trecia Rogers, M.D.    Medications: Scheduled Meds:   . sodium  chloride   Intravenous STAT  . albuterol  2.5 mg Nebulization Q4H  . albuterol  2.5 mg Nebulization Q4H  . amLODipine  10 mg Oral Daily  . antiseptic oral rinse   Mouth Rinse BID  . atenolol  50 mg Oral Daily  . cloNIDine  0.2 mg Oral TID  . enoxaparin (LOVENOX) injection  40 mg Subcutaneous Q24H  . famotidine  20 mg Oral BID  . glimepiride  4 mg Oral QAC breakfast  . hydrALAZINE  25 mg Oral Q8H  . insulin aspart  0-5 Units Subcutaneous QHS  . insulin aspart  0-9 Units Subcutaneous TID WC  . insulin glargine  30 Units Subcutaneous QAM  . ipratropium  0.5 mg Nebulization Q4H  . methylPREDNISolone sodium succinate  81.25 mg Intravenous Q6H  . moxifloxacin  400 mg Intravenous Q24H  . sodium chloride  10 mL Intracatheter Q12H  . sodium chloride  3 mL Intravenous Q12H  . DISCONTD: insulin aspart  0-15 Units Subcutaneous TID WC  . DISCONTD: methylPREDNISolone sodium succinate  60 mg Intravenous Q8H   Continuous Infusions:   . sodium chloride     PRN Meds:.acetaminophen, acetaminophen, albuterol, hydrALAZINE, HYDROcodone-acetaminophen, ipratropium, sodium chloride, sodium chloride, DISCONTD: albuterol, DISCONTD: ipratropium  Assessment/Plan: Principal Problem:  *Acute respiratory failure: Secondary to asthma exacerbation, and suspected underlying sleep apnea. Continue scheduled and PRN albuterol and Atrovent nebs Decrease Solu-Medrol to 60 mg IV every 6, continue moxifloxacin, use BiPAP at night.  2-D echo done, ejection fraction is 65-70%, due to body habitus unable to assess PA pressures and LV diastolic function.  It is highly likely that he has obstructive sleep apnea as well, he scheduled for a sleep study at the end of the month  Active Problems:  Asthma exacerbation: As above Essential hypertension: Continue home medications  DM (diabetes mellitus):  Uncontrolled hyperglycemia, worsened by steroids, increase Lantus and sliding scale  Chronic renal insufficiency: Stable  Morbid  obesity; lifestyle modification  Hyperlipidemia Transfer to tele, OOB to chair Possible DC 10/14 if better     LOS: 2 days   Brandon Lowery 02/18/2011, 7:45 AM

## 2011-02-19 LAB — GLUCOSE, CAPILLARY
Glucose-Capillary: 263 mg/dL — ABNORMAL HIGH (ref 70–99)
Glucose-Capillary: 356 mg/dL — ABNORMAL HIGH (ref 70–99)

## 2011-02-19 LAB — BASIC METABOLIC PANEL
BUN: 58 mg/dL — ABNORMAL HIGH (ref 6–23)
CO2: 24 mEq/L (ref 19–32)
Chloride: 99 mEq/L (ref 96–112)
Creatinine, Ser: 3.45 mg/dL — ABNORMAL HIGH (ref 0.50–1.35)

## 2011-02-19 MED ORDER — PREDNISONE 20 MG PO TABS
50.0000 mg | ORAL_TABLET | ORAL | Status: DC
  Administered 2011-02-19 – 2011-02-20 (×2): 50 mg via ORAL
  Filled 2011-02-19: qty 2
  Filled 2011-02-19: qty 1

## 2011-02-19 MED ORDER — FUROSEMIDE 10 MG/ML IJ SOLN
40.0000 mg | Freq: Two times a day (BID) | INTRAMUSCULAR | Status: DC
  Administered 2011-02-19 – 2011-02-21 (×5): 40 mg via INTRAVENOUS
  Filled 2011-02-19 (×5): qty 4

## 2011-02-19 NOTE — Progress Notes (Signed)
Subjective: Patient seen, denies any shortness of breath , but has lower legs edema. Patient's ECHO shows EF 65%. Patient now has O2 sats of 92% on room air.  Objective: Vital signs in last 24 hours: Temp:  [96 F (35.6 C)-97.8 F (36.6 C)] 97.8 F (36.6 C) (10/14 0600) Pulse Rate:  [75-77] 75  (10/14 0600) Resp:  [22-24] 24  (10/14 0600) BP: (143-164)/(81-92) 164/91 mmHg (10/14 0600) SpO2:  [92 %-94 %] 92 % (10/14 0600) Weight change:  Last BM Date: 02/18/11  Intake/Output from previous day: 10/13 0701 - 10/14 0700 In: -  Out: 450 [Urine:450]     Physical Exam: General: Alert, awake, oriented x3, in no acute distress. HEENT: No bruits, no goiter. Heart: Regular rate and rhythm, without murmurs, rubs, gallops. Lungs: Clear to auscultation bilaterally. Abdomen: Soft, nontender, nondistended, positive bowel sounds. Extremities: Bilateral 1+ edema of lower extremities. Neuro: Grossly intact, nonfocal.    Lab Results: Basic Metabolic Panel:  Basename 02/17/11 0440 02/16/11 1328  NA 136 138  K 4.4 4.0  CL 101 103  CO2 26 28  GLUCOSE 318* 221*  BUN 37* 30*  CREATININE 2.82* 2.38*  CALCIUM 8.5 8.6  MG -- --  PHOS -- --    CBC:  Basename 02/17/11 0440 02/16/11 1328  WBC 10.0 8.5  NEUTROABS -- --  HGB 13.0 12.4*  HCT 41.0 38.5*  MCV 86.9 85.9  PLT 320 239   Cardiac Enzymes:  Basename 02/16/11 1328  CKTOTAL --  CKMB --  CKMBINDEX --  TROPONINI <0.30   BNP:  Basename 02/17/11 0440 02/16/11 1328  POCBNP 1933.0* 1887.0*   D-Dimer: No results found for this basename: DDIMER:2 in the last 72 hours CBG:  Basename 02/18/11 2124 02/18/11 1614 02/18/11 1152 02/18/11 0805 02/18/11 0642 02/17/11 2120  GLUCAP 309* 285* 282* 211* 179* 306*   Hemoglobin A1C: No results found for this basename: HGBA1C in the last 72 hours s: No results found for this basename: TSH,T4TOTAL,FREET4,T3FREE,THYROIDAB in the last 72 hours  Urine Drug Screen:  Alcohol  Level: No results found for this basename: ETH:2 in the last 72 hours    Recent Results (from the past 240 hour(s))  MRSA PCR SCREENING     Status: Normal   Collection Time   02/16/11 10:14 PM      Component Value Range Status Comment   MRSA by PCR NEGATIVE  NEGATIVE  Final     Studies/Results: No results found.  Medications: Scheduled Meds:   . amLODipine  10 mg Oral Daily  . antiseptic oral rinse   Mouth Rinse BID  . atenolol  50 mg Oral Daily  . cloNIDine  0.2 mg Oral TID  . enoxaparin (LOVENOX) injection  40 mg Subcutaneous Q24H  . famotidine  20 mg Oral BID  . glimepiride  4 mg Oral QAC breakfast  . hydrALAZINE  25 mg Oral Q8H  . insulin aspart  0-9 Units Subcutaneous TID WC  . insulin glargine  35 Units Subcutaneous QAM  . methylPREDNISolone sodium succinate  60 mg Intravenous Q6H  . moxifloxacin  400 mg Oral q1800  . sodium chloride  10 mL Intracatheter Q12H  . sodium chloride  3 mL Intravenous Q12H  . DISCONTD: Influenza (>/= 3 years) inactive virus vaccine  0.5 mL Intramuscular Once  . DISCONTD: insulin aspart  0-5 Units Subcutaneous QHS  . DISCONTD: insulin glargine  30 Units Subcutaneous QAM  . DISCONTD: methylPREDNISolone sodium succinate  81.25 mg Intravenous Q6H  . DISCONTD:  moxifloxacin  400 mg Intravenous Q24H   Continuous Infusions:   . sodium chloride     PRN Meds:.acetaminophen, acetaminophen, albuterol, hydrALAZINE, HYDROcodone-acetaminophen, ipratropium, sodium chloride, sodium chloride, zolpidem  Assessment/Plan:  Principal Problem:  Acute respiratory failure: improved , Multifactorial. Due to asthma, sleep apnea and ? diastolic dysfunction. May need lasix for diuresis as CXR showed pulmonary edema. Will d/c Solumedrol and start Po prednisone. Active Problems:  Essential hypertension: BP is stable , will continue clonidine, hydralazine, atenolol.  DM (diabetes mellitus), type 2 with renal complications: Continue lantus and sliding scale  insulin.  Chronic renal insufficiency: Will check BMP today.  Morbid obesity  Hyperlipidemia  Asthma exacerbation: Continue avelox and prednisone taper.    LOS: 3 days   Haly Feher S 02/19/2011, 8:59 AM

## 2011-02-20 LAB — GLUCOSE, CAPILLARY
Glucose-Capillary: 295 mg/dL — ABNORMAL HIGH (ref 70–99)
Glucose-Capillary: 282 mg/dL — ABNORMAL HIGH (ref 70–99)
Glucose-Capillary: >600 mg/dL (ref 70–99)
Glucose-Capillary: 208 mg/dL — ABNORMAL HIGH (ref 70–99)
Glucose-Capillary: 257 mg/dL — ABNORMAL HIGH (ref 70–99)

## 2011-02-20 LAB — BASIC METABOLIC PANEL
Chloride: 100 mEq/L (ref 96–112)
Creatinine, Ser: 3.04 mg/dL — ABNORMAL HIGH (ref 0.50–1.35)
GFR calc Af Amer: 27 mL/min — ABNORMAL LOW (ref >90)

## 2011-02-20 LAB — CARDIAC PANEL(CRET KIN+CKTOT+MB+TROPI)
Relative Index: 4.4 — ABNORMAL HIGH (ref 0.0–2.5)
Total CK: 219 U/L (ref 7–232)

## 2011-02-20 MED ORDER — PREDNISONE 20 MG PO TABS
30.0000 mg | ORAL_TABLET | ORAL | Status: DC
  Administered 2011-02-21 – 2011-02-23 (×3): 30 mg via ORAL
  Filled 2011-02-20 (×3): qty 1

## 2011-02-20 NOTE — Progress Notes (Signed)
Spoke with MD [place] concerning the patients CKMB 9.7. Continue to monitor patient and give lasix @ scheduled time. EKG done NSR with t-wave abnormality.

## 2011-02-20 NOTE — Progress Notes (Signed)
Inpatient Diabetes Program Recommendations  AACE/ADA: New Consensus Statement on Inpatient Glycemic Control (2009)  Target Ranges:  Prepandial:   less than 140 mg/dL      Peak postprandial:   less than 180 mg/dL (1-2 hours)      Critically ill patients:  140 - 180 mg/dL   Reason for Visit: Elevated glucose:  282, 163, 194, 356 mg/dL  Inpatient Diabetes Program Recommendations Correction (SSI): Increase to Moderate Novolog Correction  Note:  Discontinued IV Solu-Medrol yesterday;  Should see a reduction in glucose;  Would not increase Lantus.

## 2011-02-20 NOTE — Plan of Care (Signed)
Problem: Consults Goal: Diabetes Guidelines if Diabetic/Glucose > 140 If diabetic or lab glucose is > 140 mg/dl - Initiate Diabetes/Hyperglycemia Guidelines & Document Interventions  Outcome: Progressing Path reviewed and patient assessed.  Recommendations made.

## 2011-02-20 NOTE — Progress Notes (Signed)
Subjective:  Patient seen and examined, complains of chest tightness, BUN/Cr is improving after starting diuresis with lasix.  Objective: Vital signs in last 24 hours: Temp:  [97 F (36.1 C)-98.1 F (36.7 C)] 98.1 F (36.7 C) (10/15 1000) Pulse Rate:  [70-95] 78  (10/15 1000) Resp:  [20-32] 20  (10/15 1000) BP: (127-161)/(80-92) 127/80 mmHg (10/15 1000) SpO2:  [91 %-96 %] 91 % (10/15 1153) FiO2 (%):  [21 %] 21 % (10/15 1153) Weight change:  Last BM Date: 02/18/11  Intake/Output from previous day: 10/14 0701 - 10/15 0700 In: 1653 [P.O.:1640; I.V.:13] Out: 400 [Urine:400]     Physical Exam: General: Alert, awake, oriented x3, in no acute distress. HEENT: No bruits, no goiter. Heart: Regular rate and rhythm, without murmurs, rubs, gallops. Lungs: Decreased breath sounds bilaterally. Abdomen: Soft, nontender, nondistended, positive bowel sounds. Extremities: Trace edema bilaterally Neuro: Grossly intact, nonfocal.    Lab Results: Basic Metabolic Panel:  Basename 02/20/11 0633 02/19/11 0912  NA 133* 134*  K 3.9 4.5  CL 100 99  CO2 24 24  GLUCOSE 223* 358*  BUN 61* 58*  CREATININE 3.04* 3.45*  CALCIUM 8.4 8.5  MG -- --  PHOS -- --   Liver Function Tests: No results found for this basename: AST:2,ALT:2,ALKPHOS:2,BILITOT:2,PROT:2,ALBUMIN:2 in the last 72 hours No results found for this basename: LIPASE:2,AMYLASE:2 in the last 72 hours No results found for this basename: AMMONIA:2 in the last 72 hours CBC: No results found for this basename: WBC:2,NEUTROABS:2,HGB:2,HCT:2,MCV:2,PLT:2 in the last 72 hours Cardiac Enzymes: No results found for this basename: CKTOTAL:3,CKMB:3,CKMBINDEX:3,TROPONINI:3 in the last 72 hours BNP: No results found for this basename: POCBNP:3 in the last 72 hours D-Dimer: No results found for this basename: DDIMER:2 in the last 72 hours CBG:  Basename 02/20/11 1211 02/20/11 0731 02/19/11 2044 02/19/11 2041 02/19/11 1658 02/19/11 1129    GLUCAP 208* 197* 356* >600* 294* 263*     Recent Results (from the past 240 hour(s))  MRSA PCR SCREENING     Status: Normal   Collection Time   02/16/11 10:14 PM      Component Value Range Status Comment   MRSA by PCR NEGATIVE  NEGATIVE  Final   CULTURE, RESPIRATORY     Status: Normal (Preliminary result)   Collection Time   02/18/11  7:20 PM      Component Value Range Status Comment   Specimen Description THROAT   Final    Special Requests NONE   Final    Gram Stain PENDING   Incomplete    Culture Culture reincubated for better growth   Final    Report Status PENDING   Incomplete     Studies/Results: No results found.  Medications: Scheduled Meds:   . amLODipine  10 mg Oral Daily  . antiseptic oral rinse   Mouth Rinse BID  . atenolol  50 mg Oral Daily  . cloNIDine  0.2 mg Oral TID  . enoxaparin (LOVENOX) injection  40 mg Subcutaneous Q24H  . famotidine  20 mg Oral BID  . furosemide  40 mg Intravenous Q12H  . glimepiride  4 mg Oral QAC breakfast  . hydrALAZINE  25 mg Oral Q8H  . insulin aspart  0-9 Units Subcutaneous TID WC  . insulin glargine  35 Units Subcutaneous QAM  . moxifloxacin  400 mg Oral q1800  . predniSONE  50 mg Oral 1 day or 1 dose  . sodium chloride  10 mL Intracatheter Q12H  . sodium chloride  3 mL  Intravenous Q12H   Continuous Infusions:   . sodium chloride     PRN Meds:.acetaminophen, acetaminophen, albuterol, hydrALAZINE, HYDROcodone-acetaminophen, ipratropium, sodium chloride, sodium chloride, zolpidem  Assessment/Plan:  Principal Problem:  Acute respiratory failure: improved , Multifactorial. Due to asthma, sleep apnea and ? diastolic dysfunction. Patient started on diuresis as CXR showed pulmonary edema. Will continue with lasix 40 mg IV Q 12 hr and check BMP in am. Active Problems:  Essential hypertension: BP is stable , will continue clonidine, hydralazine, atenolol.  DM (diabetes mellitus), type 2 with renal complications: Continue  lantus and sliding scale insulin.  Chronic renal insufficiency: Creatinine now improving, follow BMP in am.  Asthma exacerbation: Continue avelox and prednisone taper. Chest tightness: Obtain EKG and cardiac enzymes, will also obtain CXR.     LOS: 4 days   Brandon Lowery S 02/20/2011, 2:38 PM

## 2011-02-20 NOTE — Progress Notes (Signed)
CRITICAL VALUE ALERT  Critical value received: CKMB 9.7  Date of notification:02/20/11  Time of notification:  1830 Critical value read back:YES  Nurse who received alert: Morene Antu  MD notified (1st page): [place]  Time of first page:  1835  MD notified (2nd page):  Time of second page:1840  Responding MD: Darrick Meigs  Time MD responded:  [phone]

## 2011-02-21 LAB — CARDIAC PANEL(CRET KIN+CKTOT+MB+TROPI)
CK, MB: 7.7 ng/mL (ref 0.3–4.0)
Relative Index: 4.7 — ABNORMAL HIGH (ref 0.0–2.5)
Relative Index: 4.7 — ABNORMAL HIGH (ref 0.0–2.5)
Total CK: 165 U/L (ref 7–232)
Troponin I: <0.3 ng/mL (ref <0.30)

## 2011-02-21 LAB — CULTURE, RESPIRATORY W GRAM STAIN

## 2011-02-21 LAB — BASIC METABOLIC PANEL
Chloride: 102 mEq/L (ref 96–112)
GFR calc Af Amer: 25 mL/min — ABNORMAL LOW (ref >90)
Potassium: 3.7 mEq/L (ref 3.5–5.1)
Sodium: 137 mEq/L (ref 135–145)

## 2011-02-21 LAB — GLUCOSE, CAPILLARY: Glucose-Capillary: 174 mg/dL — ABNORMAL HIGH (ref 70–99)

## 2011-02-21 MED ORDER — PHENOL 1.4 % MT LIQD
1.0000 | OROMUCOSAL | Status: DC | PRN
  Administered 2011-02-22: 1 via OROMUCOSAL
  Filled 2011-02-21: qty 177

## 2011-02-21 MED ORDER — SODIUM CHLORIDE 0.9 % IJ SOLN
INTRAMUSCULAR | Status: AC
  Administered 2011-02-21: 10 mL
  Filled 2011-02-21: qty 10

## 2011-02-21 NOTE — Progress Notes (Signed)
CRITICAL VALUE ALERT  Critical value received:  CKMB 8.0  Date of notification:  02/21/11  Time of notification: 0200  Critical value read back:yes  Nurse who received alert:  Havery Moros RN  MD notified (1st page):  Dr Marin Comment  Time of first page:  0222  MD notified (2nd page):  Time of second page:  Responding MD:    Time MD responded:

## 2011-02-21 NOTE — Progress Notes (Signed)
CRITICAL VALUE ALERT  Critical value received CKMB 7.7  Date of notification:02/21/11  Time of notification:  0713  Critical value read back:YES  Nurse who received alert:Jevan Gaunt Drue Flirt  MD notified (1st page):  LAMA  Time of first page:  0713  MD notified (2nd page):  Time of second [id number]  Responding MD:  Darrick Meigs  Time MD responded:  [phone]

## 2011-02-21 NOTE — Progress Notes (Signed)
Subjective:  Patient seen and examined, Breathing has improved, Renal function getting worse. Also BP not well controlled. Chest pain is resolved with cardiac enzymes x 3 negative.  Objective: Vital signs in last 24 hours: Temp:  [97.3 F (36.3 C)-97.8 F (36.6 C)] 97.7 F (36.5 C) (10/16 0658) Pulse Rate:  [71-94] 72  (10/16 0658) Resp:  [20-22] 20  (10/16 0658) BP: (144-179)/(79-108) 170/108 mmHg (10/16 0658) SpO2:  [92 %-96 %] 96 % (10/16 0658) Weight change:  Last BM Date: 02/21/11  Intake/Output from previous day: 10/15 0701 - 10/16 0700 In: 180 [P.O.:180] Out: 500 [Urine:500] Total I/O In: -  Out: 500 [Urine:500]   Physical Exam: General: Alert, awake, oriented x3, in no acute distress. HEENT: No bruits, no goiter. Heart: Regular rate and rhythm, without murmurs, rubs, gallops. Lungs: decreased breath sounds bilaterally. Abdomen: Soft, nontender, nondistended, positive bowel sounds. Extremities: No clubbing cyanosis or edema with positive pedal pulses. Neuro: Grossly intact, nonfocal.    Lab Results: Basic Metabolic Panel:  Basename 02/21/11 0603 02/20/11 0633  NA 137 133*  K 3.7 3.9  CL 102 100  CO2 26 24  GLUCOSE 221* 223*  BUN 59* 61*  CREATININE 3.22* 3.04*  CALCIUM 8.0* 8.4  MG -- --  PHOS -- --   Liver Function Tests: No results found for this basename: AST:2,ALT:2,ALKPHOS:2,BILITOT:2,PROT:2,ALBUMIN:2 in the last 72 hours No results found for this basename: LIPASE:2,AMYLASE:2 in the last 72 hours No results found for this basename: AMMONIA:2 in the last 72 hours CBC: No results found for this basename: WBC:2,NEUTROABS:2,HGB:2,HCT:2,MCV:2,PLT:2 in the last 72 hours Cardiac Enzymes:  Basename 02/21/11 0603 02/21/11 0055 02/20/11 1606  CKTOTAL 165 171 219  CKMB 7.7* 8.0* 9.7*  CKMBINDEX -- -- --  TROPONINI <0.30 <0.30 <0.30   BNP: No results found for this basename: POCBNP:3 in the last 72 hours D-Dimer: No results found for this  basename: DDIMER:2 in the last 72 hours CBG:  Basename 02/21/11 0755 02/20/11 2050 02/20/11 1550 02/20/11 1211 02/20/11 0731 02/19/11 2044  GLUCAP 174* 312* 257* 208* 197* 356*    Recent Results (from the past 240 hour(s))  MRSA PCR SCREENING     Status: Normal   Collection Time   02/16/11 10:14 PM      Component Value Range Status Comment   MRSA by PCR NEGATIVE  NEGATIVE  Final   CULTURE, RESPIRATORY     Status: Normal   Collection Time   02/18/11  7:20 PM      Component Value Range Status Comment   Specimen Description THROAT   Final    Special Requests NONE   Final    Gram Stain     Final    Value: FEW WBC PRESENT, PREDOMINANTLY PMN     FEW SQUAMOUS EPITHELIAL CELLS PRESENT     FEW GRAM POSITIVE COCCI IN PAIRS     RARE GRAM NEGATIVE RODS   Culture NORMAL OROPHARYNGEAL FLORA   Final    Report Status 02/21/2011 FINAL   Final     Studies/Results: No results found.  Medications: Scheduled Meds:   . amLODipine  10 mg Oral Daily  . antiseptic oral rinse   Mouth Rinse BID  . atenolol  50 mg Oral Daily  . cloNIDine  0.2 mg Oral TID  . enoxaparin (LOVENOX) injection  40 mg Subcutaneous Q24H  . famotidine  20 mg Oral BID  . glimepiride  4 mg Oral QAC breakfast  . hydrALAZINE  25 mg Oral Q8H  .  insulin aspart  0-9 Units Subcutaneous TID WC  . insulin glargine  35 Units Subcutaneous QAM  . moxifloxacin  400 mg Oral q1800  . predniSONE  30 mg Oral 1 day or 1 dose  . sodium chloride  10 mL Intracatheter Q12H  . sodium chloride  3 mL Intravenous Q12H  . DISCONTD: furosemide  40 mg Intravenous Q12H  . DISCONTD: predniSONE  50 mg Oral 1 day or 1 dose   Continuous Infusions:   . sodium chloride     PRN Meds:.acetaminophen, acetaminophen, albuterol, hydrALAZINE, HYDROcodone-acetaminophen, ipratropium, phenol, sodium chloride, sodium chloride, zolpidem  Assessment/Plan: Acute respiratory failure: improved , Multifactorial. Due to asthma, sleep apnea and ? diastolic  dysfunction.  Will d/c lasix due to worsening renal function. Essential hypertension: BP is elevated , will continue clonidine, hydralazine, atenolol. Also increase hydralazine to 50 mg po q 8hr. DM (diabetes mellitus), type 2 with renal complications: Continue lantus and sliding scale insulin.  Acute on Chronic renal insufficiency: Creatinine around 3, will  Hold lasix and check BMET in am. Asthma exacerbation: Continue avelox and prednisone taper.  DVT Prophylaxis: Continue Lovenox     LOS: 5 days   Shaheed Schmuck S 02/21/2011, 12:10 PM

## 2011-02-22 ENCOUNTER — Inpatient Hospital Stay (HOSPITAL_COMMUNITY): Payer: BC Managed Care – PPO

## 2011-02-22 LAB — BASIC METABOLIC PANEL
BUN: 53 mg/dL — ABNORMAL HIGH (ref 6–23)
CO2: 26 mEq/L (ref 19–32)
Chloride: 104 mEq/L (ref 96–112)
Creatinine, Ser: 3.11 mg/dL — ABNORMAL HIGH (ref 0.50–1.35)
Glucose, Bld: 163 mg/dL — ABNORMAL HIGH (ref 70–99)

## 2011-02-22 LAB — GLUCOSE, CAPILLARY
Glucose-Capillary: 208 mg/dL — ABNORMAL HIGH (ref 70–99)
Glucose-Capillary: 217 mg/dL — ABNORMAL HIGH (ref 70–99)

## 2011-02-22 MED ORDER — SODIUM CHLORIDE 0.9 % IJ SOLN
INTRAMUSCULAR | Status: AC
  Administered 2011-02-22: 10 mL
  Filled 2011-02-22: qty 10

## 2011-02-22 MED ORDER — ENOXAPARIN SODIUM 100 MG/ML ~~LOC~~ SOLN
90.0000 mg | SUBCUTANEOUS | Status: DC
  Administered 2011-02-22: 90 mg via SUBCUTANEOUS
  Filled 2011-02-22 (×2): qty 1

## 2011-02-22 NOTE — Progress Notes (Signed)
Subjective:  Patient seen and examined, Breathing has improved, Renal function slightly improved. Also BP not well controlled. Chest pain is resolved with cardiac enzymes x 3 negative.  Objective: Vital signs in last 24 hours: Temp:  [97.5 F (36.4 C)-98.2 F (36.8 C)] 97.5 F (36.4 C) (10/17 1033) Pulse Rate:  [70-99] 73  (10/17 1033) Resp:  [20-22] 22  (10/17 1033) BP: (148-169)/(81-95) 169/95 mmHg (10/17 1033) SpO2:  [94 %-97 %] 97 % (10/17 1033) Weight change:  Last BM Date: 02/21/11  Intake/Output from previous day: 10/16 0701 - 10/17 0700 In: 5100.2 [I.V.:5074.2; IV Piggyback:26] Out: 1080 [Urine:1080]     Physical Exam: General: Alert, awake, oriented x3, in no acute distress. HEENT: No bruits, no goiter. Heart: Regular rate and rhythm, without murmurs, rubs, gallops. Lungs: decreased breath sounds bilaterally. Abdomen: Soft, nontender, nondistended, positive bowel sounds. Extremities: No clubbing cyanosis, 1+ edema with positive pedal pulses. Neuro: Grossly intact, nonfocal.    Lab Results: Basic Metabolic Panel:  Basename 02/22/11 0529 02/21/11 0603  NA 139 137  K 4.0 3.7  CL 104 102  CO2 26 26  GLUCOSE 163* 221*  BUN 53* 59*  CREATININE 3.11* 3.22*  CALCIUM 8.1* 8.0*  MG -- --  PHOS -- --   Liver Function Tests: No results found for this basename: AST:2,ALT:2,ALKPHOS:2,BILITOT:2,PROT:2,ALBUMIN:2 in the last 72 hours No results found for this basename: LIPASE:2,AMYLASE:2 in the last 72 hours No results found for this basename: AMMONIA:2 in the last 72 hours CBC: No results found for this basename: WBC:2,NEUTROABS:2,HGB:2,HCT:2,MCV:2,PLT:2 in the last 72 hours Cardiac Enzymes:  Basename 02/21/11 0603 02/21/11 0055 02/20/11 1606  CKTOTAL 165 171 219  CKMB 7.7* 8.0* 9.7*  CKMBINDEX -- -- --  TROPONINI <0.30 <0.30 <0.30   BNP: No results found for this basename: POCBNP:3 in the last 72 hours D-Dimer: No results found for this basename:  DDIMER:2 in the last 72 hours CBG:  Basename 02/22/11 1059 02/22/11 0745 02/21/11 2108 02/21/11 1236 02/21/11 0755 02/20/11 2050  GLUCAP 143* 116* 302* 149* 174* 312*    Recent Results (from the past 240 hour(s))  MRSA PCR SCREENING     Status: Normal   Collection Time   02/16/11 10:14 PM      Component Value Range Status Comment   MRSA by PCR NEGATIVE  NEGATIVE  Final   CULTURE, RESPIRATORY     Status: Normal   Collection Time   02/18/11  7:20 PM      Component Value Range Status Comment   Specimen Description THROAT   Final    Special Requests NONE   Final    Gram Stain     Final    Value: FEW WBC PRESENT, PREDOMINANTLY PMN     FEW SQUAMOUS EPITHELIAL CELLS PRESENT     FEW GRAM POSITIVE COCCI IN PAIRS     RARE GRAM NEGATIVE RODS   Culture NORMAL OROPHARYNGEAL FLORA   Final    Report Status 02/21/2011 FINAL   Final     Studies/Results: No results found.  Medications: Scheduled Meds:    . amLODipine  10 mg Oral Daily  . antiseptic oral rinse   Mouth Rinse BID  . atenolol  50 mg Oral Daily  . cloNIDine  0.2 mg Oral TID  . enoxaparin (LOVENOX) injection  40 mg Subcutaneous Q24H  . famotidine  20 mg Oral BID  . glimepiride  4 mg Oral QAC breakfast  . hydrALAZINE  25 mg Oral Q8H  . insulin aspart  0-9 Units Subcutaneous TID WC  . insulin glargine  35 Units Subcutaneous QAM  . moxifloxacin  400 mg Oral q1800  . predniSONE  30 mg Oral 1 day or 1 dose  . sodium chloride  10 mL Intracatheter Q12H  . sodium chloride  3 mL Intravenous Q12H   Continuous Infusions:    . sodium chloride     PRN Meds:.acetaminophen, acetaminophen, albuterol, hydrALAZINE, HYDROcodone-acetaminophen, ipratropium, phenol, sodium chloride, sodium chloride, zolpidem  Assessment/Plan: Acute respiratory failure: improved , Multifactorial. Due to asthma, sleep apnea and ? diastolic dysfunction, improving.  Essential hypertension: BP is elevated , will continue clonidine, hydralazine, atenolol.  Increased hydralazine to 50 mg po q 8hr yesterday. DM (diabetes mellitus), type 2 with renal complications: Continue lantus and sliding scale insulin.  Acute on Chronic renal insufficiency: Creatinine around 3.11,  Lasix on hold. Will get a nephrology consult and check BMET in am. Asthma exacerbation: Continue avelox and prednisone taper and nebulizer treatments.  DVT Prophylaxis: Continue Lovenox increase Lovenox secondary to body weight.     LOS: 6 days   Rosana Hoes MD, Sharlet Salina 02/22/2011, 1:58 PM

## 2011-02-22 NOTE — Consult Note (Signed)
Brandon Lowery MRN: [id number] DOB/AGE: [birth year] 45 y.o. Primary Care Physician:Makoti, Lonell Grandchild, MD, MD Admit date: 02/16/2011 Chief Complaint:  Chief Complaint  Patient presents with  . Shortness of Breath  . Cough   HPI:  Pt  is a 45 y/o male with known h/o asthma, who states he started having SOB.  Pt was wheezing, he has a mild cough, productive of grey sputum. No blood.  He reports no fevers, mild chills. Pt was having worsening exertional dyspnea.  Pt was admitted with Acute Resp Failure was treated for Both Asthma exacerbation and Diastolic CHF. Pt feels much better than before. Pt offers No new complaints. NO Chest pain NO hematuria     Past Medical History  Diagnosis Date  . DM (diabetes mellitus), type 2 with renal complications DX: [id number]    Uncontrolled.  . Asthma DX: 2009  . Chronic renal insufficiency     Baseline Cr 1.6-2  . Abscess 09/2009    H/O abdominal wall MRSA abscess. S/P I&D (09/2009)  . HTN (hypertension)   . Morbid obesity   . Proteinuria        Family History  Problem Relation Age of Onset  . Cancer Mother 84    breast cancer  . Asthma Father   . Hypertension Father    NO hx of ESRD  Social History:  reports that he has never smoked. He does not have any smokeless tobacco history on file. He reports that he does not drink alcohol or use illicit drugs.   Allergies: No Known Allergies  Medications Prior to Admission  Medication Dose Route Frequency Provider Last Rate Last Dose  . 0.9 %  sodium chloride infusion   Intravenous STAT Mervin Kung, MD 50 mL/hr at 02/17/11 0600    . acetaminophen (TYLENOL) tablet 650 mg  650 mg Oral Q6H PRN Debby Crosley, MD       Or  . acetaminophen (TYLENOL) suppository 650 mg  650 mg Rectal Q6H PRN Debby Crosley, MD      . albuterol (PROVENTIL) (5 MG/ML) 0.5% nebulizer solution 2.5 mg  2.5 mg Nebulization Once Mervin Kung, MD   2.5 mg at 02/16/11 1508  . albuterol (PROVENTIL) (5 MG/ML) 0.5%  nebulizer solution 2.5 mg  2.5 mg Nebulization Q4H Mervin Kung, MD   2.5 mg at 02/17/11 0431  . albuterol (PROVENTIL) (5 MG/ML) 0.5% nebulizer solution 2.5 mg  2.5 mg Nebulization Q4H Preetha Joseph   2.5 mg at 02/17/11 1149  . ipratropium (ATROVENT) nebulizer solution 0.5 mg  0.5 mg Nebulization Q2H PRN Preetha Joseph   0.5 mg at 02/20/11 1153   And  . albuterol (PROVENTIL) (5 MG/ML) 0.5% nebulizer solution 2.5 mg  2.5 mg Nebulization Q2H PRN Preetha Joseph   2.5 mg at 02/20/11 1153  . albuterol (PROVENTIL) (5 MG/ML) 0.5% nebulizer solution 5 mg  5 mg Nebulization Once Mervin Kung, MD   5 mg at 02/16/11 1643  . albuterol (PROVENTIL) (5 MG/ML) 0.5% nebulizer solution        5 mg at 02/16/11 1316  . amLODipine (NORVASC) tablet 10 mg  10 mg Oral Daily Debby Crosley, MD   10 mg at 02/22/11 1132  . antiseptic oral rinse (BIOTENE) solution   Mouth Rinse BID Debby Crosley, MD      . atenolol (TENORMIN) tablet 50 mg  50 mg Oral Daily Debby Crosley, MD   50 mg at 02/22/11 1133  . cloNIDine (CATAPRES) tablet 0.1 mg  0.1  mg Oral Once Preetha Joseph   0.1 mg at 02/16/11 2227  . cloNIDine (CATAPRES) tablet 0.2 mg  0.2 mg Oral TID Debby Crosley, MD   0.2 mg at 02/22/11 1500  . enoxaparin (LOVENOX) injection 90 mg  90 mg Subcutaneous Q24H Novlet Jarrett-Davis      . famotidine (PEPCID) tablet 20 mg  20 mg Oral BID Debby Crosley, MD   20 mg at 02/22/11 1134  . furosemide (LASIX) 10 MG/ML injection 60 mg  60 mg Intravenous Once Debby Crosley, MD   60 mg at 02/16/11 2106  . glimepiride (AMARYL) tablet 4 mg  4 mg Oral QAC breakfast Debby Crosley, MD   4 mg at 02/22/11 0847  . hydrALAZINE (APRESOLINE) injection 10 mg  10 mg Intravenous Q6H PRN Debby Crosley, MD   10 mg at 02/17/11 1443  . hydrALAZINE (APRESOLINE) tablet 25 mg  25 mg Oral Q8H Preetha Joseph   25 mg at 02/22/11 1501  . HYDROcodone-acetaminophen (NORCO) 5-325 MG per tablet 1-2 tablet  1-2 tablet Oral Q4H PRN Debby Crosley, MD      .  insulin aspart (novoLOG) injection 0-9 Units  0-9 Units Subcutaneous TID WC Preetha Joseph   1 Units at 02/22/11 1134  . insulin glargine (LANTUS) injection 35 Units  35 Units Subcutaneous QAM Preetha Joseph   35 Units at 02/22/11 0601  . ipratropium (ATROVENT) 0.02 % nebulizer solution        0.5 mg at 02/16/11 1316  . ipratropium (ATROVENT) nebulizer solution 0.5 mg  0.5 mg Nebulization Once Mervin Kung, MD   0.5 mg at 02/16/11 1508  . ipratropium (ATROVENT) nebulizer solution 0.5 mg  0.5 mg Nebulization Q4H Preetha Joseph   0.5 mg at 02/17/11 1148  . moxifloxacin (AVELOX) tablet 400 mg  400 mg Oral q1800 Preetha Joseph   400 mg at 02/21/11 1708  . phenol (CHLORASEPTIC) mouth spray 1 spray  1 spray Mouth/Throat PRN Gagan S Lama   1 spray at 02/22/11 1146  . predniSONE (DELTASONE) tablet 20 mg  20 mg Oral Once Mervin Kung, MD   20 mg at 02/16/11 1703  . predniSONE (DELTASONE) tablet 30 mg  30 mg Oral 1 day or 1 dose Gagan S Lama   30 mg at 02/22/11 0847  . predniSONE (DELTASONE) tablet 60 mg  60 mg Oral Once Mervin Kung, MD   60 mg at 02/16/11 1630  . sodium chloride 0.9 % injection 10 mL  10 mL Intracatheter Q12H Mark A Jenkins   10 mL at 02/22/11 1132  . sodium chloride 0.9 % injection 10 mL  10 mL Intracatheter PRN Mark A Jenkins   10 mL at 02/21/11 0057  . zolpidem (AMBIEN) tablet 10 mg  10 mg Oral QHS PRN Quintella Baton, MD   10 mg at 02/21/11 2222  . DISCONTD: 0.9 %  sodium chloride infusion  250 mL Intravenous Continuous Debby Crosley, MD      . DISCONTD: albuterol (PROVENTIL) (5 MG/ML) 0.5% nebulizer solution 2.5 mg  2.5 mg Nebulization Q4H PRN Debby Crosley, MD      . DISCONTD: cloNIDine (CATAPRES) tablet 0.1 mg  0.1 mg Oral TID Quintella Baton, MD   0.1 mg at 02/16/11 2108  . DISCONTD: enoxaparin (LOVENOX) injection 40 mg  40 mg Subcutaneous Q24H Debby Crosley, MD   40 mg at 02/21/11 2142  . DISCONTD: furosemide (LASIX) 10 MG/ML injection 40 mg  40 mg Intravenous Q12H  [place]  40 mg at 02/21/11 1129  . DISCONTD: Influenza (>/= 3 years) inactive virus vaccine (FLVIRIN/FLUZONE) injection SUSP 0.5 mL  0.5 mL Intramuscular Once Domenic Polite      . DISCONTD: insulin aspart (novoLOG) injection 0-15 Units  0-15 Units Subcutaneous TID WC Debby Crosley, MD      . DISCONTD: insulin aspart (novoLOG) injection 0-5 Units  0-5 Units Subcutaneous QHS Quintella Baton, MD   4 Units at 02/17/11 2203  . DISCONTD: insulin glargine (LANTUS) injection 30 Units  30 Units Subcutaneous QAM Quintella Baton, MD   30 Units at 02/18/11 0643  . DISCONTD: ipratropium (ATROVENT) nebulizer solution 0.5 mg  0.5 mg Nebulization Q4H PRN Debby Crosley, MD   0.5 mg at 02/17/11 0432  . DISCONTD: methylPREDNISolone sodium succinate (SOLU-MEDROL) 125 MG injection 125 mg  125 mg Intravenous Once Mervin Kung, MD      . DISCONTD: methylPREDNISolone sodium succinate (SOLU-MEDROL) 125 MG injection 60 mg  60 mg Intravenous Q8H Debby Crosley, MD   60 mg at 02/17/11 0611  . DISCONTD: methylPREDNISolone sodium succinate (SOLU-MEDROL) 125 MG injection 60 mg  60 mg Intravenous Q6H Preetha Joseph   60 mg at 02/19/11 0557  . DISCONTD: methylPREDNISolone sodium succinate (SOLU-MEDROL) 125 MG injection 81.25 mg  81.25 mg Intravenous Q6H Mervin Kung, MD      . DISCONTD: methylPREDNISolone sodium succinate (SOLU-MEDROL) 125 MG injection 81.25 mg  81.25 mg Intravenous Q6H Preetha Joseph   81.25 mg at 02/18/11 0643  . DISCONTD: moxifloxacin (AVELOX) IVPB 400 mg  400 mg Intravenous Once Mervin Kung, MD      . DISCONTD: moxifloxacin (AVELOX) IVPB 400 mg  400 mg Intravenous Q24H Debby Crosley, MD   400 mg at 02/17/11 2106  . DISCONTD: predniSONE (DELTASONE) tablet 50 mg  50 mg Oral 1 day or 1 dose Gagan S Lama   50 mg at 02/20/11 0917  . DISCONTD: sodium chloride 0.9 % injection 3 mL  3 mL Intravenous Q12H Debby Crosley, MD   3 mL at 02/21/11 1000  . DISCONTD: sodium chloride 0.9 % injection 3 mL  3 mL  Intravenous PRN Debby Crosley, MD       Medications Prior to Admission  Medication Sig Dispense Refill  . albuterol (VENTOLIN HFA) 108 (90 BASE) MCG/ACT inhaler Inhale 2 puffs into the lungs every 4 (four) hours as needed.  1 Inhaler  3  . amLODipine (NORVASC) 10 MG tablet Take 1 tablet (10 mg total) by mouth daily.  30 tablet  6  . atenolol (TENORMIN) 50 MG tablet Take 1 tablet (50 mg total) by mouth daily.  90 tablet  1  . cloNIDine (CATAPRES) 0.1 MG tablet Take 1 tablet (0.1 mg total) by mouth daily.  90 tablet  1  . fluticasone (FLOVENT HFA) 110 MCG/ACT inhaler Inhale 1 puff into the lungs 2 (two) times daily.  1 Inhaler  6  . glimepiride (AMARYL) 4 MG tablet Take 1 tablet (4 mg total) by mouth daily before breakfast.  90 tablet  1  . insulin glargine (LANTUS) 100 UNIT/ML injection Inject 30 Units into the skin every morning. Inject insulin as directed- Solostar pen      . Pitavastatin Calcium (LIVALO) 4 MG TABS 1 tab po qhs for cholesterol  90 tablet  1  . zolpidem (AMBIEN) 10 MG tablet Take 1 tablet (10 mg total) by mouth at bedtime as needed for sleep.  30 tablet  2       [id number] from  the symptoms mentioned above,there are no other symptoms referable to all systems reviewed.     Marland Kitchen amLODipine  10 mg Oral Daily  . antiseptic oral rinse   Mouth Rinse BID  . atenolol  50 mg Oral Daily  . cloNIDine  0.2 mg Oral TID  . enoxaparin (LOVENOX) injection  90 mg Subcutaneous Q24H  . famotidine  20 mg Oral BID  . glimepiride  4 mg Oral QAC breakfast  . hydrALAZINE  25 mg Oral Q8H  . insulin aspart  0-9 Units Subcutaneous TID WC  . insulin glargine  35 Units Subcutaneous QAM  . moxifloxacin  400 mg Oral q1800  . predniSONE  30 mg Oral 1 day or 1 dose  . sodium chloride  10 mL Intracatheter Q12H  . DISCONTD: enoxaparin (LOVENOX) injection  40 mg Subcutaneous Q24H  . DISCONTD: sodium chloride  3 mL Intravenous Q12H         [id number] from the symptoms mentioned above,there are no  other symptoms referable to all systems reviewed.  Physical Exam: Vital signs in last 24 hours: Temp:  [97.5 F (36.4 C)-98.2 F (36.8 C)] 97.5 F (36.4 C) (10/17 1033) Pulse Rate:  [70-99] 73  (10/17 1033) Resp:  [20-22] 22  (10/17 1033) BP: (148-169)/(81-95) 169/95 mmHg (10/17 1033) SpO2:  [94 %-97 %] 97 % (10/17 1033) Weight change:  Last BM Date: 02/21/11  Intake/Output from previous day: 10/16 0701 - 10/17 0700 In: 5100.2 [I.V.:5074.2; IV Piggyback:26] Out: 1080 [Urine:1080]     Physical Exam: General- pt is awake,alert, oriented to time place and person Resp- No acute REsp distress, CTA B/L NO Rhonchi CVS- S1S2 regular ij rate and rhythm GIT- BS+, soft, NT, ND EXT- NO LE Edema, Cyanosis CNS- CN 2-12 grossly intact. Moving all 4 extremities Psych- normal mod and affect    Lab Results:  Results for EFFORD, DENIO (MRN [id number]) as of 02/22/2011 15:33  Ref. Range 02/17/2011 04:40  HGB Latest Range: 13.0-17.0 g/dL 13.0    BMET  Basename 02/22/11 0529 02/21/11 0603  NA 139 137  K 4.0 3.7  CL 104 102  CO2 26 26  GLUCOSE 163* 221*  BUN 53* 59*  CREATININE 3.11* 3.22*  CALCIUM 8.1* 8.0*      MICRO Recent Results (from the past 240 hour(s))  MRSA PCR SCREENING     Status: Normal   Collection Time   02/16/11 10:14 PM      Component Value Range Status Comment   MRSA by PCR NEGATIVE  NEGATIVE  Final   CULTURE, RESPIRATORY     Status: Normal   Collection Time   02/18/11  7:20 PM      Component Value Range Status Comment   Specimen Description THROAT   Final    Special Requests NONE   Final    Gram Stain     Final    Value: FEW WBC PRESENT, PREDOMINANTLY PMN     FEW SQUAMOUS EPITHELIAL CELLS PRESENT     FEW GRAM POSITIVE COCCI IN PAIRS     RARE GRAM NEGATIVE RODS   Culture NORMAL OROPHARYNGEAL FLORA   Final    Report Status 02/21/2011 FINAL   Final       Lab Results  Component Value Date   CALCIUM 8.1* 02/22/2011   PHOS 5.4* 10/21/2010      2D Echo in Oct 2012 - Procedure narrative: Transthoracic echocardiography. Image quality was fair. The study was technically difficult, as a result of poor acoustic  windows. - Left ventricle: The cavity size was normal. Wall thickness was increased in a pattern of mild LVH. Systolic function was vigorous. The estimated ejection fraction was in the range of 65% to 70%. Wall motion was normal; there were no regional wall motion abnormalities. The study is not technically sufficient to allow evaluation of LV diastolic function. - Mitral valve: Calcified annulus. Mildly thickened leaflets . Trivial regurgitation. - Left atrium: The atrium was mildly dilated. - Tricuspid valve: Trivial regurgitation. - Pulmonary arteries: Systolic pressure could not be accurately estimated. - Pericardium, extracardiac: There was no pericardial effusion. Systemic veins:   Inferior vena cava: The vessel was normal in size; the   respirophasic diameter changes were in the normal range (=   50%); findings are consistent with normal central venous   Pressure.   2D echo in June 2012  Left ventricle: The cavity size was normal. Wall thickness was   increased in a pattern of mild LVH. Systolic function was normal.   The estimated ejection fraction was in the range of 60% to 65%.   Images were inadequate for LV wall motion assessment. Doppler   parameters are consistent with abnormal left ventricular relaxation   (grade 1 diastolic dysfunction).   CXR 02/16/11 IMPRESSION:  Enlargement of cardiac silhouette with pulmonary vascular  congestion.  Decreased lung volumes with bibasilar atelectasis.     Impression: 1)Renal  AKI on CKD                AKI secondary to PreRenal/ ATN                AKI secondary to CHF + SIRS state                                CKD stage 3 .               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                 Progression of CKD   Marked with AKI                Proteinura will check. 2)HTN Target Organ damage  CKD Medication-  On Calcium Channel Blockers On Beta blockers On Vasodilators- Hydralzine On Central Acting Sympatholytics- Clonidine   3)Anemia HGb at goal (9--11)  4)CKD Mineral-Bone Disorder- PTH will check Secondary Hyperparathyroidism w/u pending  Phosphorus at goal.   5)FEN  Normokalemic NOrmonatremic Euvolemic-Pt IVC is normal in 2d echo though BNP is high- This High BNP is from LVH + AKI   6)Acid base Co2 at goal   7) Morbid Obesity  8) Sleep pnea- To be Scheduled for sleep study  Plan:  Agree with current treatment and plan 1) will get Renal US 2) Will get Spot protein/ Creat 3) will get BMD  Labs 4) will follow as inpt . 5) will start lasix 40mg  po BId  for CHF/ CKD as pt very likely to go into fluid overload.       Franshesca Chipman S 02/22/2011, 3:31 PM

## 2011-02-23 LAB — PHOSPHORUS: Phosphorus: 4 mg/dL (ref 2.3–4.6)

## 2011-02-23 LAB — CBC
Hemoglobin: 13.6 g/dL (ref 13.0–17.0)
MCHC: 32.7 g/dL (ref 30.0–36.0)
Platelets: 246 10*3/uL (ref 150–400)
RBC: 4.99 MIL/uL (ref 4.22–5.81)

## 2011-02-23 LAB — GLUCOSE, CAPILLARY

## 2011-02-23 LAB — BASIC METABOLIC PANEL
GFR calc Af Amer: 28 mL/min — ABNORMAL LOW (ref >90)
GFR calc non Af Amer: 24 mL/min — ABNORMAL LOW (ref >90)
Potassium: 3.9 mEq/L (ref 3.5–5.1)
Sodium: 140 mEq/L (ref 135–145)

## 2011-02-23 LAB — VITAMIN D 25 HYDROXY (VIT D DEFICIENCY, FRACTURES): Vit D, 25-Hydroxy: <10 ng/mL — ABNORMAL LOW (ref 30–89)

## 2011-02-23 MED ORDER — FUROSEMIDE 40 MG PO TABS: ORAL_TABLET | ORAL | Status: DC

## 2011-02-23 MED ORDER — MOXIFLOXACIN HCL 400 MG PO TABS: 400.0000 mg | ORAL_TABLET | Freq: Every day | ORAL | Status: AC

## 2011-02-23 MED ORDER — HYDRALAZINE HCL 25 MG PO TABS: 25.0000 mg | ORAL_TABLET | Freq: Three times a day (TID) | ORAL | Status: DC

## 2011-02-23 MED ORDER — METHYLPREDNISOLONE 4 MG PO KIT: PACK | ORAL | Status: AC

## 2011-02-23 NOTE — Progress Notes (Signed)
Pt discharged home in stable condition with wife.  Pt instructed on how to take new meds and to make follow - up appts in one week with PCP and nephrologist.  Pt verbalizes understanding.  CVC removed.

## 2011-02-23 NOTE — Progress Notes (Signed)
Subjective: Interval History: has no complaint of shortness of breath orthopnea. He seems to be feeling better..  Objective: Vital signs in last 24 hours: Temp:  [97.3 F (36.3 C)-97.8 F (36.6 C)] 97.8 F (36.6 C) (10/18 0604) Pulse Rate:  [68-73] 71  (10/18 0604) Resp:  [16-22] 16  (10/18 0604) BP: (125-169)/(81-95) 128/90 mmHg (10/18 0604) SpO2:  [92 %-97 %] 96 % (10/18 0604) Weight change:   Intake/Output from previous day: 10/17 0701 - 10/18 0700 In: 240 [P.O.:240] Out: -  Intake/Output this shift:    General appearance: alert and cooperative Resp: diminished breath sounds bilaterally Cardio: regular rate and rhythm, S1, S2 normal, no murmur, click, rub or gallop GI: . Obess, to palpate organomegaly abdomen is nontender. Extremities: edema Possibly 2+ edema  Lab Results:  Basename 02/23/11 0541  WBC 12.0*  HGB 13.6  HCT 41.6  PLT 246   BMET:  Basename 02/23/11 0541 02/22/11 0529  NA 140 139  K 3.9 4.0  CL 104 104  CO2 28 26  GLUCOSE 79 163*  BUN 49* 53*  CREATININE 2.94* 3.11*  CALCIUM 8.3* 8.1*   No results found for this basename: PTH:2 in the last 72 hours Iron Studies: No results found for this basename: IRON,TIBC,TRANSFERRIN,FERRITIN in the last 72 hours  Studies/Results: US Renal  02/22/2011  *RADIOLOGY REPORT*  Clinical Data: Hypertension.  RENAL/URINARY TRACT ULTRASOUND COMPLETE  Comparison:  None.  Findings:  Right Kidney:  Limited examination due to body habitus.  The kidney is 10.5 cm in length.  Normal renal cortical thickness and echogenicity without focal lesions or hydronephrosis.  Left Kidney:  Could not be visualized due to body habitus.  Bladder:  Normal.  IMPRESSION: Limited examination due to body habitus.  The left kidney could not be visualized.  No hydronephrosis on the right.  Original Report Authenticated By: P. Kalman Jewels, M.D.    I have reviewed the patient's current medications.  Assessment/Plan: Problem #1 renal failure  seems to be acute on chronic his BUN and creatinine seems to be improving. Her presently patient doesn't have any nausea vomiting. His potassium is normal. The etiology for superimposed renal failure seems to be possibly prerenal secondary to diuretic use. Problem #2 anasarca at this moment patient denies any previous history of sleep apnea seems to be obese hence we may be dealing with the sleep apnea with right-sided heart failure and possibly chronic leg edema. Problem #3 history of obesity Problem #4 history of her diabetes random blood sugar seems to be reasonably controlled Problem #5 history of hypertension blood pressure seems to be controlled very well. Problem #6 history of difficulty increasing possibly combination of her exacerbation of asthma and the CHF presently seems to be feeling better. Her recommendation we'll continue his present management and when he is discharged patient may require diuretics to be used as when necessary  We'll check his basic metabolic panel tomorrow. An   LOS: 7 days   Roshawn Ayala S 02/23/2011,8:31 AM

## 2011-02-23 NOTE — Progress Notes (Signed)
Patient placed on cpap  Tonight no problems

## 2011-02-23 NOTE — Discharge Summary (Signed)
DISCHARGE SUMMARY  Brandon Lowery  MR#: [id number]  DOB:[birth year]  Date of Admission: 02/16/2011 Date of Discharge: 02/23/2011  Attending Physician:Davis MD, Sharlet Salina  Patient's [id number], Brandon Grandchild, MD, MD  Consults:Treatment Team:  Harriett Sine  Discharge Diagnoses: .Asthma exacerbation .Essential hypertension, benign .DM (diabetes mellitus), type 2 with renal complications .Chronic renal insufficiency .Morbid obesity .Hyperlipidemia .Acute respiratory failure    Current Discharge Medication List    START taking these medications   Details  furosemide (LASIX) 40 MG tablet PRN Qty: 30 tablet, Refills: 0    hydrALAZINE (APRESOLINE) 25 MG tablet Take 1 tablet (25 mg total) by mouth 3 (three) times daily. Qty: 90 tablet, Refills: 0    methylPREDNISolone (MEDROL, PAK,) 4 MG tablet follow package directions Qty: 21 tablet, Refills: 0    moxifloxacin (AVELOX) 400 MG tablet Take 1 tablet (400 mg total) by mouth daily at 6 PM. Qty: 6 tablet, Refills: 0      CONTINUE these medications which have NOT CHANGED   Details  albuterol (VENTOLIN HFA) 108 (90 BASE) MCG/ACT inhaler Inhale 2 puffs into the lungs every 4 (four) hours as needed. Qty: 1 Inhaler, Refills: 3    amLODipine (NORVASC) 10 MG tablet Take 1 tablet (10 mg total) by mouth daily. Qty: 30 tablet, Refills: 6    atenolol (TENORMIN) 50 MG tablet Take 1 tablet (50 mg total) by mouth daily. Qty: 90 tablet, Refills: 1    cloNIDine (CATAPRES) 0.1 MG tablet Take 1 tablet (0.1 mg total) by mouth daily. Qty: 90 tablet, Refills: 1    fluticasone (FLOVENT HFA) 110 MCG/ACT inhaler Inhale 1 puff into the lungs 2 (two) times daily. Qty: 1 Inhaler, Refills: 6    glimepiride (AMARYL) 4 MG tablet Take 1 tablet (4 mg total) by mouth daily before breakfast. Qty: 90 tablet, Refills: 1    insulin glargine (LANTUS) 100 UNIT/ML injection Inject 30 Units into the skin every morning. Inject insulin as directed-  Solostar pen    Pitavastatin Calcium (LIVALO) 4 MG TABS 1 tab po qhs for cholesterol Qty: 90 tablet, Refills: 1    zolpidem (AMBIEN) 10 MG tablet Take 1 tablet (10 mg total) by mouth at bedtime as needed for sleep. Qty: 30 tablet, Refills: 2      HPI: This is a 45 y/o male with known h/o asthma, who states he started having SOB approximately four days ago. He has been wheezing, he has a mild cough, productive of grey sputum. No blood. He reports no fevers, mild chills. Today he had an episode of nausea and vomiting. He has worsening exertional dyspnea. No chest pains. He does not have a nebulizer and has been using his inhalers with no effect. He has been having more frequent asthma exacerbations in the past few months. He has never been intubated. He decided to wait until he was off from work before presenting to the ER.    Hospital Course: .Asthma exacerbation:  Patient was treated with steroids and antibiotics along with nebulizers. His symptoms improved. He was told to followup outpatient with his primary care physician within one week. Patient is also morbidly obese and this could be one of the contradicting factors to shortness of breath. Patient was told to follow up with his primary care physician for sleep study for possibly be fitted with a CPAP machine. Patient stated that he had to do some training at work this coming week and he does not think pulmonary-wise that he would be able to do  it. He was given a note for work and he will followup with his primary care physician for medical clearance prior to resuming his straining.  .Essential hypertension, benign:  Patient will continue taking his home  blood pressure medication. Hydralazine was added to his medication regimen secondary to his blood pressure being elevated.    (diabetes mellitus), type 2 with renal complications Patient blood sugar was controlled during his hospitalization.  . Chronic renal insufficiency Patient's  creatinine increased increased during the hospitalization. This was most likely secondary to Lasix being started because of lower extremity edema. Nephrology was consult and recommend Lasix to be when necessary. Patient creatinine improved prior to discharge. Patient will follow up outpatient with nephrology for his chronic renal insufficiency. Patient to have repeat labs done with primary care physician on followup.  .Morbid obesity Discussed extensively with the patient that most of his medical problems were related to his morbid obesity. I encouraged him to follow a low-calorie diet and exercise.  . Acute respiratory failure: Patient's respiratory failure is most likely multifactorial.  He feels much better today. He will followup with his primary care physician in one week.    Day of Discharge BP 132/64  Pulse 67  Temp(Src) 97.4 F (36.3 C) (Oral)  Resp 24  Ht 5\' 10"  (1.778 m)  Wt 187.789 kg (414 lb)  BMI 59.40 kg/m2  SpO2 97%  Physical Exam: General: Alert, awake, oriented x3, in no acute distress.  HEENT: No bruits, no goiter.  Heart: Regular rate and rhythm, without murmurs, rubs, gallops.  Lungs: decreased breath sounds bilaterally.  Abdomen: Soft, nontender, nondistended, positive bowel sounds.  Extremities: No clubbing cyanosis, 1+ edema with positive pedal pulses.  Neuro: Grossly intact, nonfocal.     Results for orders placed during the hospital encounter of 02/16/11 (from the past 24 hour(s))  GLUCOSE, CAPILLARY     Status: Abnormal   Collection Time   02/22/11  4:21 PM      Component Value Range   Glucose-Capillary 208 (*) 70 - 99 (mg/dL)   Comment 1 Notify RN     Comment 2 Documented in Chart    GLUCOSE, CAPILLARY     Status: Abnormal   Collection Time   02/22/11  8:58 PM      Component Value Range   Glucose-Capillary 217 (*) 70 - 99 (mg/dL)  BASIC METABOLIC PANEL     Status: Abnormal   Collection Time   02/23/11  5:41 AM      Component Value Range    Sodium 140  135 - 145 (mEq/L)   Potassium 3.9  3.5 - 5.1 (mEq/L)   Chloride 104  96 - 112 (mEq/L)   CO2 28  19 - 32 (mEq/L)   Glucose, Bld 79  70 - 99 (mg/dL)   BUN 49 (*) 6 - 23 (mg/dL)   Creatinine, Ser 2.94 (*) 0.50 - 1.35 (mg/dL)   Calcium 8.3 (*) 8.4 - 10.5 (mg/dL)   GFR calc non Af Amer 24 (*) >90 (mL/min)   GFR calc Af Amer 28 (*) >90 (mL/min)  CBC     Status: Abnormal   Collection Time   02/23/11  5:41 AM      Component Value Range   WBC 12.0 (*) 4.0 - 10.5 (K/uL)   RBC 4.99  4.22 - 5.81 (MIL/uL)   Hemoglobin 13.6  13.0 - 17.0 (g/dL)   HCT 41.6  39.0 - 52.0 (%)   MCV 83.4  78.0 - 100.0 (fL)  MCH 27.3  26.0 - 34.0 (pg)   MCHC 32.7  30.0 - 36.0 (g/dL)   RDW 14.8  11.5 - 15.5 (%)   Platelets 246  150 - 400 (K/uL)  PHOSPHORUS     Status: Normal   Collection Time   02/23/11  5:41 AM      Component Value Range   Phosphorus 4.0  2.3 - 4.6 (mg/dL)  GLUCOSE, CAPILLARY     Status: Normal   Collection Time   02/23/11  6:09 AM      Component Value Range   Glucose-Capillary 71  70 - 99 (mg/dL)  GLUCOSE, CAPILLARY     Status: Normal   Collection Time   02/23/11  7:59 AM      Component Value Range   Glucose-Capillary 97  70 - 99 (mg/dL)  GLUCOSE, CAPILLARY     Status: Abnormal   Collection Time   02/23/11 11:41 AM      Component Value Range   Glucose-Capillary 153 (*) 70 - 99 (mg/dL)   Comment 1 Documented in Chart     Comment 2 Notify RN      Disposition: Home  Follow-up Appts: Discharge Orders    Future Appointments: Provider: Department: Dept Phone: Center:   06/14/2011 10:30 AM Vic Blackbird, MD [facility] [phone] [facility]     Followup with Dr. Hinda Lenis in one week regarding your kidney function. Tests Needing Follow-up: Basic Metabolic Panel  Signed: Rosana Hoes MD, Sharlet Salina 02/23/2011, 2:29 PM

## 2011-02-24 LAB — PTH, INTACT AND CALCIUM
Calcium, Total (PTH): 8.6 mg/dL (ref 8.4–10.5)
PTH: 263.8 pg/mL — ABNORMAL HIGH (ref 14.0–72.0)

## 2011-06-14 ENCOUNTER — Ambulatory Visit: Payer: BC Managed Care – PPO | Admitting: Family Medicine

## 2012-08-28 ENCOUNTER — Other Ambulatory Visit: Payer: Self-pay | Admitting: Family Medicine

## 2018-01-14 ENCOUNTER — Inpatient Hospital Stay
Admission: AD | Admit: 2018-01-14 | Payer: Self-pay | Source: Other Acute Inpatient Hospital | Admitting: Internal Medicine

## 2018-01-15 ENCOUNTER — Inpatient Hospital Stay (HOSPITAL_COMMUNITY): Payer: Medicare Other

## 2018-01-15 ENCOUNTER — Inpatient Hospital Stay (HOSPITAL_COMMUNITY)
Admission: AD | Admit: 2018-01-15 | Discharge: 2018-01-19 | DRG: 871 | Disposition: A | Discharge status: Home / Self Care | Payer: Medicare Other | Source: Other Acute Inpatient Hospital | Attending: Pulmonary Disease | Admitting: Pulmonary Disease

## 2018-01-15 ENCOUNTER — Inpatient Hospital Stay: Payer: Self-pay

## 2018-01-15 DIAGNOSIS — R059 Cough, unspecified: Secondary | ICD-10-CM

## 2018-01-15 DIAGNOSIS — Z8614 Personal history of Methicillin resistant Staphylococcus aureus infection: Secondary | ICD-10-CM | POA: Diagnosis not present

## 2018-01-15 DIAGNOSIS — M5416 Radiculopathy, lumbar region: Secondary | ICD-10-CM | POA: Diagnosis present

## 2018-01-15 DIAGNOSIS — N186 End stage renal disease: Secondary | ICD-10-CM | POA: Diagnosis present

## 2018-01-15 DIAGNOSIS — Z992 Dependence on renal dialysis: Secondary | ICD-10-CM

## 2018-01-15 DIAGNOSIS — A419 Sepsis, unspecified organism: Principal | ICD-10-CM | POA: Diagnosis present

## 2018-01-15 DIAGNOSIS — N189 Chronic kidney disease, unspecified: Secondary | ICD-10-CM | POA: Diagnosis present

## 2018-01-15 DIAGNOSIS — D631 Anemia in chronic kidney disease: Secondary | ICD-10-CM | POA: Diagnosis present

## 2018-01-15 DIAGNOSIS — Z8249 Family history of ischemic heart disease and other diseases of the circulatory system: Secondary | ICD-10-CM | POA: Diagnosis not present

## 2018-01-15 DIAGNOSIS — I132 Hypertensive heart and chronic kidney disease with heart failure and with stage 5 chronic kidney disease, or end stage renal disease: Secondary | ICD-10-CM | POA: Diagnosis present

## 2018-01-15 DIAGNOSIS — I959 Hypotension, unspecified: Secondary | ICD-10-CM | POA: Diagnosis present

## 2018-01-15 DIAGNOSIS — Z794 Long term (current) use of insulin: Secondary | ICD-10-CM | POA: Diagnosis not present

## 2018-01-15 DIAGNOSIS — N289 Disorder of kidney and ureter, unspecified: Secondary | ICD-10-CM | POA: Diagnosis present

## 2018-01-15 DIAGNOSIS — E872 Acidosis: Secondary | ICD-10-CM | POA: Diagnosis present

## 2018-01-15 DIAGNOSIS — R52 Pain, unspecified: Secondary | ICD-10-CM

## 2018-01-15 DIAGNOSIS — Z79899 Other long term (current) drug therapy: Secondary | ICD-10-CM | POA: Diagnosis not present

## 2018-01-15 DIAGNOSIS — K5732 Diverticulitis of large intestine without perforation or abscess without bleeding: Secondary | ICD-10-CM | POA: Diagnosis present

## 2018-01-15 DIAGNOSIS — R05 Cough: Secondary | ICD-10-CM

## 2018-01-15 DIAGNOSIS — J45909 Unspecified asthma, uncomplicated: Secondary | ICD-10-CM | POA: Diagnosis present

## 2018-01-15 DIAGNOSIS — R6521 Severe sepsis with septic shock: Secondary | ICD-10-CM | POA: Diagnosis present

## 2018-01-15 DIAGNOSIS — E876 Hypokalemia: Secondary | ICD-10-CM | POA: Diagnosis present

## 2018-01-15 DIAGNOSIS — M545 Low back pain: Secondary | ICD-10-CM | POA: Diagnosis present

## 2018-01-15 DIAGNOSIS — Z825 Family history of asthma and other chronic lower respiratory diseases: Secondary | ICD-10-CM

## 2018-01-15 DIAGNOSIS — T827XXA Infection and inflammatory reaction due to other cardiac and vascular devices, implants and grafts, initial encounter: Secondary | ICD-10-CM

## 2018-01-15 DIAGNOSIS — I509 Heart failure, unspecified: Secondary | ICD-10-CM | POA: Diagnosis present

## 2018-01-15 DIAGNOSIS — Z6837 Body mass index (BMI) 37.0-37.9, adult: Secondary | ICD-10-CM | POA: Diagnosis not present

## 2018-01-15 DIAGNOSIS — Z7951 Long term (current) use of inhaled steroids: Secondary | ICD-10-CM | POA: Diagnosis not present

## 2018-01-15 DIAGNOSIS — E1122 Type 2 diabetes mellitus with diabetic chronic kidney disease: Secondary | ICD-10-CM | POA: Diagnosis present

## 2018-01-15 DIAGNOSIS — I5030 Unspecified diastolic (congestive) heart failure: Secondary | ICD-10-CM

## 2018-01-15 LAB — RENAL FUNCTION PANEL
Albumin: 2.8 g/dL — ABNORMAL LOW (ref 3.5–5.0)
Anion gap: 19 — ABNORMAL HIGH (ref 5–15)
BUN: 57 mg/dL — AB (ref 6–20)
CALCIUM: 8.2 mg/dL — AB (ref 8.9–10.3)
CO2: 21 mmol/L — AB (ref 22–32)
CREATININE: 13.66 mg/dL — AB (ref 0.61–1.24)
Chloride: 98 mmol/L (ref 98–111)
GFR calc non Af Amer: 4 mL/min — ABNORMAL LOW (ref >60)
GFR, EST AFRICAN AMERICAN: 4 mL/min — AB (ref >60)
GLUCOSE: 124 mg/dL — AB (ref 70–99)
Phosphorus: 6 mg/dL — ABNORMAL HIGH (ref 2.5–4.6)
Potassium: 3.7 mmol/L (ref 3.5–5.1)
SODIUM: 138 mmol/L (ref 135–145)

## 2018-01-15 LAB — CBC WITH DIFFERENTIAL/PLATELET
BASOS PCT: 0 %
Basophils Absolute: 0 10*3/uL (ref 0.0–0.1)
EOS PCT: 0 %
Eosinophils Absolute: 0 10*3/uL (ref 0.0–0.7)
HEMATOCRIT: 43.9 % (ref 39.0–52.0)
Hemoglobin: 13.5 g/dL (ref 13.0–17.0)
Lymphocytes Relative: 2 %
Lymphs Abs: 0.7 10*3/uL (ref 0.7–4.0)
MCH: 27.4 pg (ref 26.0–34.0)
MCHC: 30.8 g/dL (ref 30.0–36.0)
MCV: 89.2 fL (ref 78.0–100.0)
MONO ABS: 1.5 10*3/uL — AB (ref 0.1–1.0)
Monocytes Relative: 4 %
Neutro Abs: 35.2 10*3/uL — ABNORMAL HIGH (ref 1.7–7.7)
Neutrophils Relative %: 94 %
PLATELETS: 210 10*3/uL (ref 150–400)
RBC: 4.92 MIL/uL (ref 4.22–5.81)
RDW: 20.9 % — AB (ref 11.5–15.5)
WBC MORPHOLOGY: INCREASED
WBC: 37.4 10*3/uL — AB (ref 4.0–10.5)

## 2018-01-15 LAB — COMPREHENSIVE METABOLIC PANEL
ALBUMIN: 2.9 g/dL — AB (ref 3.5–5.0)
ALT: 143 U/L — AB (ref 0–44)
AST: 95 U/L — AB (ref 15–41)
Alkaline Phosphatase: 71 U/L (ref 38–126)
Anion gap: 20 — ABNORMAL HIGH (ref 5–15)
BUN: 54 mg/dL — AB (ref 6–20)
CHLORIDE: 101 mmol/L (ref 98–111)
CO2: 17 mmol/L — AB (ref 22–32)
CREATININE: 13.54 mg/dL — AB (ref 0.61–1.24)
Calcium: 8.2 mg/dL — ABNORMAL LOW (ref 8.9–10.3)
GFR calc Af Amer: 4 mL/min — ABNORMAL LOW (ref >60)
GFR, EST NON AFRICAN AMERICAN: 4 mL/min — AB (ref >60)
GLUCOSE: 66 mg/dL — AB (ref 70–99)
POTASSIUM: 3.7 mmol/L (ref 3.5–5.1)
SODIUM: 138 mmol/L (ref 135–145)
Total Bilirubin: 0.7 mg/dL (ref 0.3–1.2)
Total Protein: 6 g/dL — ABNORMAL LOW (ref 6.5–8.1)

## 2018-01-15 LAB — GLUCOSE, CAPILLARY: GLUCOSE-CAPILLARY: 74 mg/dL (ref 70–99)

## 2018-01-15 LAB — ECHOCARDIOGRAM COMPLETE
HEIGHTINCHES: 71 in
Weight: 4296.32 oz

## 2018-01-15 LAB — LIPASE, BLOOD: Lipase: 22 U/L (ref 11–51)

## 2018-01-15 LAB — PROTIME-INR
INR: 1.48
Prothrombin Time: 17.8 seconds — ABNORMAL HIGH (ref 11.4–15.2)

## 2018-01-15 LAB — PROCALCITONIN: Procalcitonin: >150 ng/mL

## 2018-01-15 LAB — MAGNESIUM: MAGNESIUM: 1.8 mg/dL (ref 1.7–2.4)

## 2018-01-15 LAB — PHOSPHORUS: Phosphorus: 5.9 mg/dL — ABNORMAL HIGH (ref 2.5–4.6)

## 2018-01-15 LAB — MRSA PCR SCREENING: MRSA by PCR: NEGATIVE

## 2018-01-15 MED ORDER — RENA-VITE PO TABS
1.0000 | ORAL_TABLET | Freq: Every day | ORAL | Status: DC
  Administered 2018-01-15 – 2018-01-18 (×4): 1 via ORAL
  Filled 2018-01-15 (×4): qty 1

## 2018-01-15 MED ORDER — ONDANSETRON HCL 4 MG/2ML IJ SOLN
4.0000 mg | Freq: Four times a day (QID) | INTRAMUSCULAR | Status: DC | PRN
  Administered 2018-01-16 – 2018-01-19 (×5): 4 mg via INTRAVENOUS
  Filled 2018-01-15 (×5): qty 2

## 2018-01-15 MED ORDER — HYDROMORPHONE HCL 1 MG/ML IJ SOLN
0.5000 mg | INTRAMUSCULAR | Status: DC | PRN
  Administered 2018-01-15: 0.5 mg via INTRAVENOUS
  Administered 2018-01-17 – 2018-01-19 (×5): 1 mg via INTRAVENOUS
  Administered 2018-01-19: 0.5 mg via INTRAVENOUS
  Filled 2018-01-15 (×2): qty 1
  Filled 2018-01-15: qty 0.5
  Filled 2018-01-15 (×4): qty 1

## 2018-01-15 MED ORDER — VANCOMYCIN VARIABLE DOSE PER UNSTABLE RENAL FUNCTION (PHARMACIST DOSING): Status: DC

## 2018-01-15 MED ORDER — CALCIUM ACETATE (PHOS BINDER) 667 MG PO CAPS
667.0000 mg | ORAL_CAPSULE | Freq: Three times a day (TID) | ORAL | Status: DC
  Administered 2018-01-15 – 2018-01-18 (×9): 667 mg via ORAL
  Filled 2018-01-15 (×8): qty 1

## 2018-01-15 MED ORDER — CINACALCET HCL 30 MG PO TABS
60.0000 mg | ORAL_TABLET | Freq: Every day | ORAL | Status: DC
  Administered 2018-01-16 – 2018-01-18 (×3): 60 mg via ORAL
  Filled 2018-01-15 (×4): qty 2

## 2018-01-15 MED ORDER — LORAZEPAM 1 MG PO TABS
1.0000 mg | ORAL_TABLET | ORAL | Status: DC
  Filled 2018-01-15: qty 1

## 2018-01-15 MED ORDER — SODIUM CHLORIDE 0.9 % IV BOLUS
500.0000 mL | Freq: Once | INTRAVENOUS | Status: AC
  Administered 2018-01-15: 500 mL via INTRAVENOUS

## 2018-01-15 MED ORDER — NOREPINEPHRINE 4 MG/250ML-% IV SOLN
INTRAVENOUS | Status: AC
  Filled 2018-01-15: qty 250

## 2018-01-15 MED ORDER — HEPARIN SODIUM (PORCINE) 5000 UNIT/ML IJ SOLN
5000.0000 [IU] | Freq: Three times a day (TID) | INTRAMUSCULAR | Status: DC
  Administered 2018-01-15 – 2018-01-19 (×10): 5000 [IU] via SUBCUTANEOUS
  Filled 2018-01-15 (×10): qty 1

## 2018-01-15 MED ORDER — CHLORHEXIDINE GLUCONATE CLOTH 2 % EX PADS: 6.0000 | MEDICATED_PAD | Freq: Every day | CUTANEOUS | Status: DC

## 2018-01-15 MED ORDER — FLUTICASONE PROPIONATE 50 MCG/ACT NA SUSP
1.0000 | Freq: Every day | NASAL | Status: DC
  Administered 2018-01-15 – 2018-01-18 (×4): 1 via NASAL
  Filled 2018-01-15: qty 16

## 2018-01-15 MED ORDER — ASPIRIN EC 81 MG PO TBEC
81.0000 mg | DELAYED_RELEASE_TABLET | Freq: Every day | ORAL | Status: DC
  Administered 2018-01-15 – 2018-01-18 (×4): 81 mg via ORAL
  Filled 2018-01-15 (×6): qty 1

## 2018-01-15 MED ORDER — NOREPINEPHRINE 4 MG/250ML-% IV SOLN
0.0000 ug/min | INTRAVENOUS | Status: DC
  Administered 2018-01-15: 10 ug/min via INTRAVENOUS
  Administered 2018-01-15: 12 ug/min via INTRAVENOUS
  Administered 2018-01-15: 18 ug/min via INTRAVENOUS
  Administered 2018-01-16: 23 ug/min via INTRAVENOUS
  Administered 2018-01-16: 25 ug/min via INTRAVENOUS
  Administered 2018-01-16: 25.013 ug/min via INTRAVENOUS
  Filled 2018-01-15 (×6): qty 250

## 2018-01-15 MED ORDER — TRAZODONE HCL 50 MG PO TABS
100.0000 mg | ORAL_TABLET | Freq: Every evening | ORAL | Status: DC | PRN
  Administered 2018-01-15 – 2018-01-18 (×2): 100 mg via ORAL
  Filled 2018-01-15 (×2): qty 2

## 2018-01-15 MED ORDER — SODIUM CHLORIDE 0.9 % IV SOLN
250.0000 mL | INTRAVENOUS | Status: DC | PRN
  Administered 2018-01-15 – 2018-01-17 (×4): 250 mL via INTRAVENOUS

## 2018-01-15 MED ORDER — SODIUM CHLORIDE 0.9 % IV SOLN
2.0000 g | INTRAVENOUS | Status: DC
  Administered 2018-01-15 – 2018-01-18 (×3): 2 g via INTRAVENOUS
  Filled 2018-01-15 (×5): qty 20

## 2018-01-15 MED ORDER — ACETAMINOPHEN 325 MG PO TABS: 650.0000 mg | ORAL_TABLET | ORAL | Status: DC | PRN

## 2018-01-15 MED ORDER — MIDODRINE HCL 5 MG PO TABS
10.0000 mg | ORAL_TABLET | Freq: Three times a day (TID) | ORAL | Status: DC
  Administered 2018-01-15 – 2018-01-18 (×9): 10 mg via ORAL
  Filled 2018-01-15 (×10): qty 2

## 2018-01-15 MED ORDER — METRONIDAZOLE IN NACL 5-0.79 MG/ML-% IV SOLN
500.0000 mg | Freq: Three times a day (TID) | INTRAVENOUS | Status: DC
  Administered 2018-01-15 – 2018-01-19 (×11): 500 mg via INTRAVENOUS
  Filled 2018-01-15 (×12): qty 100

## 2018-01-15 MED ORDER — ORAL CARE MOUTH RINSE
15.0000 mL | Freq: Two times a day (BID) | OROMUCOSAL | Status: DC
  Administered 2018-01-17 – 2018-01-18 (×3): 15 mL via OROMUCOSAL

## 2018-01-15 NOTE — Consult Note (Signed)
HPI: Brandon Lowery who is a 52 y.o. 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 Per family the patient was on home HD on 9/9 when he developed rigor, confusion, hypotension, and hypothermia. His sister who is a Marine scientist at [employer] recommended he be evaluated and EMS was called. The patient believes that he programed the HD wrong and just pulled too much fluid. He has had 1 similar episode to this in the past but states it was 5-6 years ago. He endorses intermittent fevers, chills, productive cough, and diarrhea for the past 6-7 days. He does still produce some urine but has not appreciated any change in the consistiency, color, or frequency. He denies any new cuts, rashes, skin changes, purulent drainage around his left subclavian catheter. Per family the patient is now back to his baseline.   The patient has been on HD for 7 years and follows with Dr. Marlowe Sax in [place]. His initial kidney injury was presumed to be 2/2 HTN. He runs for 2.5-3 hours 5 days per week. He has been going to [facility] for fistula placement but has had his AVFs clot in his right arm, left arm, and right leg. For the past 6 months he has been getting HD through a tunneled HD catheter. His new HD catheter was placed 30 days prior.   Review of outside records indicates the patient presented via EMS and was found to be hypotensive, tachycardic, tachypneic, and febrile. CT chest and abdomen were significant for possible PNA and diverticulitis. Labs as follows; WBC 10.1 (left shift on diff), Hgb 12.2, Hct 38.1, RDW 19.9, Plt 187, Na 139, K 2.7, Cl 102, CO2 24, BUN 47, Ca 8.3, Albumin 3.1, bilirubin 0.9, AST 430, ALT 215, Alk Phos 105, lactic acid 3.0, PO4 1.4, Procalcitonin 195. ABG 7.55/24.5/71.6.   Past Medical History:  Diagnosis Date  . Abscess 09/2009   H/O abdominal wall MRSA abscess. S/P I&D (09/2009)  . Asthma DX: 2009  . Chronic renal insufficiency    Baseline  Cr 1.6-2  . DM (diabetes mellitus), type 2 with renal complications DX: 3810   Uncontrolled.  Marland Kitchen HTN (hypertension)   . Morbid obesity   . Proteinuria    Past Surgical History:  Procedure Laterality Date  . HERNIA REPAIR  1985  . I&D of abdominal wall abscess  09/2009   By Dr. Ninfa Linden   Social History:  reports that he has never smoked. He does not have any smokeless tobacco history on file. He reports that he does not drink alcohol or use drugs. Allergies: No Known Allergies Family History  Problem Relation Age of Onset  . Cancer Mother 5       breast cancer  . Asthma Father   . Hypertension Father    Medications:  Scheduled: . aspirin EC  81 mg Oral Daily  . calcium acetate  667 mg Oral TID WC  . [START ON 01/16/2018] cinacalcet  60 mg Oral Q breakfast  . fluticasone  1 spray Each Nare Daily  . heparin  5,000 Units Subcutaneous Q8H  . LORazepam  1 mg Oral On Call  . mouth rinse  15 mL Mouth Rinse BID  . midodrine  10 mg Oral TID WC  . multivitamin  1 tablet Oral QHS  . vancomycin variable dose per unstable renal function (pharmacist dosing)   Does not apply See admin instructions   ROS: 12 point ROS preformed.  All negative aside from those mentioned in the HPI.  Blood pressure 90/64, pulse 91, temperature 99 F (37.2 C), temperature source Oral, resp. rate (!) 27, height _0  (1.803 m), weight 121.8 kg, SpO2 93 %.  General: Obese male in no acute distress HENT: Normocephalic, atraumatic, moist mucus membranes Pulm: Good air movement with right basilar crackles  CV: RRR, no murmurs, no rubs  Abdomen: Active bowel sounds, soft, non-distended, no tenderness to palpation  Extremities: Pulses palpable in all extremities, no LE edema  Skin: Warm and dry  Neuro: Alert and oriented x 3  Results for orders placed or performed during the hospital encounter of 01/15/18 (from the past 48 hour(s))  Glucose, capillary     Status: None   Collection Time: 01/15/18  9:07 AM   Result Value Ref Range   Glucose-Capillary 74 70 - 99 mg/dL   No results found.  Assessment / Plan:  ESRD on HD - Patient on home HD 5 days per week, last session 9/9 (incomplete, ran 2 of 3 hours) - Access via left subclavian HD catheter  - Outside labs reviewed. Hgb 12.2, K 2.7, CO2 24, BUN 47, Ca 9.1 (corrected), PO4 1.4.  - Will repeat renal function panel here.  - Patient is currently off pressors, and may tolerate intermittent HD.   Normocytic Anemia  - Hgb 12.2 with elevated RDW  - Hgb at goal, no need for Epo with HD  Septic Shock  - Initially presented to outside hospital with tachycardia, tachypnea, and fever. Labs significant for elevated procalcitonin and lactic acid. CT chest abdomen with ?PNA and diverticulitis. Started on pressors and given Vancomycin / Zosyn / Flagyl.  - Now off pressor support. Maintain MAP >65  - Repeat CMP to evaluate LFT abnormalities. AST:ALT 2:1 with intrahepatic injury pattern.  - Repeat BCs   - Abx and management per PCCM   Will discuss the case further with Dr. Carolin Sicks.   Niema Carrara 01/15/2018, 11:38 AM

## 2018-01-15 NOTE — H&P (Addendum)
STEVIN BIELINSKI  GNF:[id number] DOB: [birth year] DOA: 01/15/2018 PCP: Alycia Rossetti, MD    LOS: 0 days   Reason for Consult / Chief Complaint:  Hypotension and back pain.  Consulting MD and date:  ED at [facility] (Dr Tamala Julian, Alfonso Patten)  HPI/Summary of hospital stay:   The patient is a 52 year old man who presented to hospital in [place] with hypotension, nausea and vomiting following home dialysis 9/9.  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  He was previously dialyzed via a fistula but was transitioned to a left subclavian tunneled catheter as his blood pressure has been too low to successfully dialyze him via a fistula.  He has been started on midodrine.  Furthermore, he reports a one-week history of low back pain associated with nonspecific malaise.  Subjective:  At present he denies any abdominal pain but endorses only localized low back pain for which she is requesting pain medication.  He states that he is hungry and wishes to eat.  Objective   Blood pressure 90/64, pulse 91, temperature 99 F (37.2 C), temperature source Oral, resp. rate (!) 27, height 5\' 11"  (1.803 m), weight 121.8 kg, SpO2 93 %.       No intake or output data in the 24 hours ending 01/15/18 1033 Filed Weights   01/15/18 1000  Weight: 121.8 kg    Examination: General: Morbidly obese.  In no distress. HENT: Extensive oropharyngeal soft tissue.  Mucous membranes are moist.  No scleral icterus. Lungs: Chest is clear bilaterally. Cardiovascular: JVP not visible.  Heart sounds unremarkable.  Capillary refill borderline.  Remains on low-dose norepinephrine. Abdomen: Abdomen is protuberant but soft and nontender with no palpable organomegaly or masses. Extremities: He has distinct midline lower lumbar pain reproducible by palpation. Neuro: Sensorium is intact.  No focal neurological deficits.  No signs of meningeal irritation. GU: No Foley catheter in place.  Consults:  date of consult/date signed off & final recs:   Nephrology for dialysis.  Procedures: Not applicable  Significant Diagnostic Tests: Outside CT of abdomen shows sigmoid diverticulitis.  No evidence of lumbar discitis.  Micro Data: Blood cultures from outside hospital and repeat cultures pending  Antimicrobials:  Vancomycin for potential bacteremia Ceftriaxone and Flagyl for diverticulitis. Resolved Hospital Problem list    Assessment & Plan:   Hypotension likely related to septic shock now controlled on norepinephrine. Appears clinically volume contracted.  Trial of fluid resuscitation. Continue to wean norepinephrine to keep mean arterial pressure greater than 65. Septic work-up as below.  Possible intravascular access related infection. Patient became unwell while on dialysis raising possibility of infected vascular access.  Indeed patient is new to performing home dialysis via this line. Continue broad-spectrum antibiotics including vancomycin adjusted for renal failure.  Repeat blood cultures.  Echocardiogram to rule out intracardiac source of infection.  Possible discitis Pain suggestive of discitis.  Will perform MRI.  Possible diverticulitis Radiographic evidence on CT scan but symptomatology atypical for diverticulitis.  Nevertheless, we will continue ceftriaxone and Flagyl pending culture results.  End-stage renal disease on hemodialysis. Renal consulted for inpatient  Disposition / Summary of Today's Plan 01/15/18   None  Best Practice / Goals of Care / Disposition.   DVT prophylaxis: Subcutaneous heparin GI prophylaxis: Not required Diet: Renal diet Mobility: Ad lib. Code Status: Full code Family Communication: Patient and family updated at bedside.  Labs   CBC: No results for input(s): WBC, NEUTROABS, HGB, HCT, MCV, PLT in the  last 168 hours. Basic Metabolic Panel: No results for input(s): NA, K, CL, CO2, GLUCOSE, BUN, CREATININE, CALCIUM, MG, PHOS in  the last 168 hours. GFR: CrCl cannot be calculated (Patient's most recent lab result is older than the maximum 21 days allowed.). No results for input(s): PROCALCITON, WBC, LATICACIDVEN in the last 168 hours. Liver Function Tests: No results for input(s): AST, ALT, ALKPHOS, BILITOT, PROT, ALBUMIN in the last 168 hours. No results for input(s): LIPASE, AMYLASE in the last 168 hours. No results for input(s): AMMONIA in the last 168 hours. ABG    Component Value Date/Time   PHART 7.252 (L) 02/17/2011 0430   PCO2ART 56.2 (H) 02/17/2011 0430   PO2ART 118.0 (H) 02/17/2011 0430   HCO3 23.9 02/17/2011 0430   TCO2 22.0 02/17/2011 0430   ACIDBASEDEF 2.3 (H) 02/17/2011 0430   O2SAT 97.4 02/17/2011 0430    Coagulation Profile: No results for input(s): INR, PROTIME in the last 168 hours. Cardiac Enzymes: No results for input(s): CKTOTAL, CKMB, CKMBINDEX, TROPONINI in the last 168 hours. HbA1C: Hgb A1c MFr Bld  Date/Time Value Ref Range Status  01/05/2011 12:30 PM 7.7 (H) <5.7 % Final    Comment:                                                                           According to the ADA Clinical Practice Recommendations for 2011, when HbA1c is used as a screening test:     >=6.5%   Diagnostic of Diabetes Mellitus            (if abnormal result is confirmed)   5.7-6.4%   Increased risk of developing Diabetes Mellitus   References:Diagnosis and Classification of Diabetes Mellitus,Diabetes QVZD,6387,56(EPPIR 1):S62-S69 and Standards of Medical Care in         Diabetes - 2011,Diabetes JJOA,4166,06 (Suppl 1):S11-S61.  10/01/2009 12.7 %    CBG: No results for input(s): GLUCAP in the last 168 hours.   Review of Systems:   Review of Systems  Constitutional: Positive for fever and malaise/fatigue.  HENT: Negative.   Respiratory: Negative.   Cardiovascular: Negative.   Gastrointestinal: Positive for constipation.  Genitourinary: Negative.   Musculoskeletal: Positive for back pain.    Skin: Negative.   Neurological: Negative.   Endo/Heme/Allergies: Negative.   Psychiatric/Behavioral: Negative.    Past medical history  He,  has a past medical history of Abscess (09/2009), Asthma (DX: 2009), Chronic renal insufficiency, DM (diabetes mellitus), type 2 with renal complications (DX: 3016), HTN (hypertension), Morbid obesity, and Proteinuria.   Surgical History    Past Surgical History:  Procedure Laterality Date  . HERNIA REPAIR  1985  . I&D of abdominal wall abscess  09/2009   By Dr. Ninfa Linden     Social History   Social History   Socioeconomic History  . Marital status: Married    Spouse name: Not on file  . Number of children: Not on file  . Years of education: Not on file  . Highest education level: Not on file  Occupational History  . Occupation: Scientist, [employer] in [place], [place]  . Financial resource strain: Not on file  . Food insecurity:    Worry: Not  on file    Inability: Not on file  . Transportation needs:    Medical: Not on file    Non-medical: Not on file  Tobacco Use  . Smoking status: Never Smoker  Substance and Sexual Activity  . Alcohol use: No  . Drug use: No  . Sexual activity: Not on file  Lifestyle  . Physical activity:    Days per week: Not on file    Minutes per session: Not on file  . Stress: Not on file  Relationships  . Social connections:    Talks on phone: Not on file    Gets together: Not on file    Attends religious service: Not on file    Active member of club or organization: Not on file    Attends meetings of clubs or organizations: Not on file    Relationship status: Not on file  . Intimate partner violence:    Fear of current or ex partner: Not on file    Emotionally abused: Not on file    Physically abused: Not on file    Forced sexual activity: Not on file  Other Topics Concern  . Not on file  Social History Narrative   Lives in [place], [place] with wife and 2  sons.  ,  reports that he has never smoked. He does not have any smokeless tobacco history on file. He reports that he does not drink alcohol or use drugs.   Family history   His family history includes Asthma in his father; Cancer (age of onset: 50) in his mother; Hypertension in his father.   Allergies No Known Allergies  Home meds  Prior to Admission medications   Medication Sig Start Date End Date Taking? Authorizing Provider  albuterol (VENTOLIN HFA) 108 (90 BASE) MCG/ACT inhaler Inhale 2 puffs into the lungs every 4 (four) hours as needed. 02/01/11   Alycia Rossetti, MD  amLODipine (NORVASC) 10 MG tablet Take 1 tablet (10 mg total) by mouth daily. 01/05/11   Alycia Rossetti, MD  atenolol (TENORMIN) 50 MG tablet Take 1 tablet (50 mg total) by mouth daily. 02/01/11   Alycia Rossetti, MD  cloNIDine (CATAPRES) 0.1 MG tablet Take 1 tablet (0.1 mg total) by mouth daily. 02/01/11   Alycia Rossetti, MD  fluticasone (FLOVENT HFA) 110 MCG/ACT inhaler Inhale 1 puff into the lungs 2 (two) times daily. 02/01/11 02/01/12  Alycia Rossetti, MD  furosemide (LASIX) 40 MG tablet PRN 02/23/11   Ernest Haber, MD  glimepiride (AMARYL) 4 MG tablet Take 1 tablet (4 mg total) by mouth daily before breakfast. 02/01/11   Alycia Rossetti, MD  hydrALAZINE (APRESOLINE) 25 MG tablet Take 1 tablet (25 mg total) by mouth 3 (three) times daily. 02/23/11 02/23/12  Ernest Haber, MD  insulin glargine (LANTUS) 100 UNIT/ML injection Inject 30 Units into the skin every morning. Inject insulin as directed- Solostar pen 02/01/11   [place], Modena Nunnery, MD  Pitavastatin Calcium (LIVALO) 4 MG TABS 1 tab po qhs for cholesterol 02/02/11   [place], Modena Nunnery, MD    CRITICAL CARE Performed by: Kipp Brood  Total critical care time: 45 minutes  Critical care time was exclusive of separately billable procedures and treating other patients.  Critical care was necessary to treat or prevent imminent or life-threatening  deterioration.  Critical care was time spent personally by me on the following activities: development of treatment plan with patient and/or surrogate as well as nursing, discussions with  consultants, evaluation of patient's response to treatment, examination of patient, obtaining history from patient or surrogate, ordering and performing treatments and interventions, ordering and review of laboratory studies, ordering and review of radiographic studies, pulse oximetry and re-evaluation of patient's condition.  Kipp Brood, MD [facility] ICU Physician [place]  Pager: [phone] Mobile: [phone] After hours: [phone].

## 2018-01-15 NOTE — Progress Notes (Signed)
  Echocardiogram 2D Echocardiogram has been performed.  Madelaine Etienne 01/15/2018, 4:57 PM

## 2018-01-15 NOTE — Progress Notes (Addendum)
Pharmacy Antibiotic Note  Brandon Lowery is a 52 y.o. male transferred from [place] on 01/15/2018 with PNA and possible bacteremia.  Patient presented to [facility] with AMS after two hours of dialysis.  He was also complaining of nausea, vomiting and diarrhea.  CT showed diverticulitis.  He was given vancomycin, Zosyn and Flagyl PTA.  Pharmacy has been consulted for vancomycin dosing.  [place]:   Vanc 2gm 9/9 around 1830 Zosyn 3.375gm 9/9 around 1800 and 9/10 at 0530 Flagyl 524m 9/9 around 2300  9/9 BCx - NGTD  WBC 10.1, CBC WNL, AST/ALT 430/215, alk phos 105, SCr 12.4, CK 277, LA 3, INR 1.4, Phos 1.4, PCT 195, troponin 0.067   Plan: F/U Renal plan for further vanc dosing CTX 2gm IV Q24H + Flagyl 5054mIV Q8H per MD Monitor renal fxn, clinical progress, vanc level as indicated   Height: '5\' 11"'  (180.3 cm) Weight: 268 lb 8.3 oz (121.8 kg) IBW/kg (Calculated) : 75.3  Temp (24hrs), Avg:99 F (37.2 C), Min:99 F (37.2 C), Max:99 F (37.2 C)  No results for input(s): WBC, CREATININE, LATICACIDVEN, VANCOTROUGH, VANCOPEAK, VANCORANDOM, GENTTROUGH, GENTPEAK, GENTRANDOM, TOBRATROUGH, TOBRAPEAK, TOBRARND, AMIKACINPEAK, AMIKACINTROU, AMIKACIN in the last 168 hours.  CrCl cannot be calculated (Patient's most recent lab result is older than the maximum 21 days allowed.).    No Known Allergies   Vanc 9/9 >> CTX 9/10 >> Flagyl 9/10 >>  9/10 MRSA PCR -    Tilak Oakley D. DaMina MarblePharmD, BCPS, BCPlum[place]/02/2018, 12:18 PM

## 2018-01-15 NOTE — Progress Notes (Signed)
[name] Progress Note Patient Name: Brandon Lowery DOB: 1966-01-10 MRN: [id number]   Date of Service  01/15/2018  HPI/Events of Note  Takes trazodone 100mg  po qHS at home  eICU Interventions  Will order        Simonne Maffucci 01/15/2018, 9:25 PM

## 2018-01-16 ENCOUNTER — Inpatient Hospital Stay (HOSPITAL_COMMUNITY): Payer: Medicare Other

## 2018-01-16 LAB — CBC
HEMATOCRIT: 37 % — AB (ref 39.0–52.0)
HEMOGLOBIN: 11.7 g/dL — AB (ref 13.0–17.0)
MCH: 27.7 pg (ref 26.0–34.0)
MCHC: 31.6 g/dL (ref 30.0–36.0)
MCV: 87.7 fL (ref 78.0–100.0)
Platelets: 219 10*3/uL (ref 150–400)
RBC: 4.22 MIL/uL (ref 4.22–5.81)
RDW: 20.4 % — AB (ref 11.5–15.5)
WBC: 35.8 10*3/uL — ABNORMAL HIGH (ref 4.0–10.5)

## 2018-01-16 LAB — BASIC METABOLIC PANEL
Anion gap: 15 (ref 5–15)
BUN: 62 mg/dL — AB (ref 6–20)
CALCIUM: 7.8 mg/dL — AB (ref 8.9–10.3)
CHLORIDE: 97 mmol/L — AB (ref 98–111)
CO2: 20 mmol/L — AB (ref 22–32)
CREATININE: 14.01 mg/dL — AB (ref 0.61–1.24)
GFR calc non Af Amer: 3 mL/min — ABNORMAL LOW (ref >60)
GFR, EST AFRICAN AMERICAN: 4 mL/min — AB (ref >60)
GLUCOSE: 195 mg/dL — AB (ref 70–99)
Potassium: 3.3 mmol/L — ABNORMAL LOW (ref 3.5–5.1)
Sodium: 132 mmol/L — ABNORMAL LOW (ref 135–145)

## 2018-01-16 LAB — GLUCOSE, CAPILLARY
GLUCOSE-CAPILLARY: 69 mg/dL — AB (ref 70–99)
GLUCOSE-CAPILLARY: 74 mg/dL (ref 70–99)
GLUCOSE-CAPILLARY: 77 mg/dL (ref 70–99)
Glucose-Capillary: 98 mg/dL (ref 70–99)
Glucose-Capillary: 63 mg/dL — ABNORMAL LOW (ref 70–99)
Glucose-Capillary: 57 mg/dL — ABNORMAL LOW (ref 70–99)
Glucose-Capillary: 59 mg/dL — ABNORMAL LOW (ref 70–99)

## 2018-01-16 LAB — HIV ANTIBODY (ROUTINE TESTING W REFLEX): HIV SCREEN 4TH GENERATION: NONREACTIVE

## 2018-01-16 LAB — VANCOMYCIN, RANDOM: Vancomycin Rm: 20

## 2018-01-16 MED ORDER — CHLORHEXIDINE GLUCONATE CLOTH 2 % EX PADS
6.0000 | MEDICATED_PAD | Freq: Every day | CUTANEOUS | Status: DC
  Administered 2018-01-16: 6 via TOPICAL

## 2018-01-16 MED ORDER — LORAZEPAM 2 MG/ML IJ SOLN
0.5000 mg | INTRAMUSCULAR | Status: AC
  Administered 2018-01-16: 0.5 mg via INTRAVENOUS
  Filled 2018-01-16: qty 1

## 2018-01-16 MED ORDER — POTASSIUM CHLORIDE 20 MEQ/15ML (10%) PO SOLN
40.0000 meq | Freq: Once | ORAL | Status: AC
  Administered 2018-01-16: 40 meq
  Filled 2018-01-16: qty 30

## 2018-01-16 MED ORDER — INSULIN ASPART 100 UNIT/ML ~~LOC~~ SOLN: 0.0000 [IU] | Freq: Every day | SUBCUTANEOUS | Status: DC

## 2018-01-16 MED ORDER — GUAIFENESIN ER 600 MG PO TB12
1200.0000 mg | ORAL_TABLET | Freq: Two times a day (BID) | ORAL | Status: DC
  Administered 2018-01-16 – 2018-01-18 (×6): 1200 mg via ORAL
  Filled 2018-01-16 (×7): qty 2

## 2018-01-16 MED ORDER — INSULIN ASPART 100 UNIT/ML ~~LOC~~ SOLN: 0.0000 [IU] | Freq: Three times a day (TID) | SUBCUTANEOUS | Status: DC

## 2018-01-16 NOTE — Progress Notes (Signed)
Pt states he does not want to wear CPAP tonight. RT will continue to monitor as needed.

## 2018-01-16 NOTE — Progress Notes (Signed)
[place] Progress Note Patient Name: Brandon Lowery DOB: 02-09-1966 MRN: [id number]   Date of Service  01/16/2018  HPI/Events of Note    eICU Interventions  Hypokalemia, repleted     Intervention Category Intermediate Interventions: Electrolyte abnormality - evaluation and management  Simonne Maffucci 01/16/2018, 6:39 AM

## 2018-01-16 NOTE — Progress Notes (Signed)
Subjective: Interval History: Patient doing well. He slept well with the CPAP. He would like to start getting up and walking to the bathroom. He endorses some lower back pain. He otherwise feels back to baseline but does admit to one episode of subjective fever overnight. Discussed the plan to coordinate with PCCM and maybe restart his Midodrine. Will also plan on HD today.   Objective: Vital signs in last 24 hours: Temp:  [98.4 F (36.9 C)-99 F (37.2 C)] 98.6 F (37 C) (09/11 0722) Pulse Rate:  [77-95] 79 (09/11 0645) Resp:  [14-31] 25 (09/11 0645) BP: (76-129)/(46-88) 89/50 (09/11 0645) SpO2:  [90 %-96 %] 95 % (09/11 0645) Weight:  [100.2 kg-121.8 kg] 100.3 kg (09/11 0350) Weight change:   Intake/Output from previous day: 09/10 0701 - 09/11 0700 In: 1103.9 [I.V.:416.9; IV Piggyback:687] Out: -    Intake/Output this shift: No intake/output data recorded.  General: Obese male in no acute distress Pulm: Distant, diminished air movement with bibasilar crackles  CV: RRR, no murmurs, no rubs  Abdomen: Soft, non-distended, no tenderness to palpation  Extremities: No LE edema  Skin: Warm, diaphoretic  Neuro: Alert and oriented x 3  Lab Results: Recent Labs    01/15/18 1040 01/16/18 0345  WBC 37.4* 35.8*  HGB 13.5 11.7*  HCT 43.9 37.0*  PLT 210 219   BMET:  Recent Labs    01/15/18 1716 01/16/18 0345  NA 138 132*  K 3.7 3.3*  CL 98 97*  CO2 21* 20*  GLUCOSE 124* 195*  BUN 57* 62*  CREATININE 13.66* 14.01*  CALCIUM 8.2* 7.8*   No results for input(s): PTH in the last 72 hours.   Iron Studies: No results for input(s): IRON, TIBC, TRANSFERRIN, FERRITIN in the last 72 hours.   Studies/Results: Ct Outside Films Body  Result Date: 01/15/2018 This examination belongs to an outside facility and is stored here for comparison purposes only.  Contact the originating outside institution for any associated report or interpretation.  Scheduled: . aspirin EC  81 mg Oral  Daily  . calcium acetate  667 mg Oral TID WC  . Chlorhexidine Gluconate Cloth  6 each Topical Q0600  . cinacalcet  60 mg Oral Q breakfast  . fluticasone  1 spray Each Nare Daily  . heparin  5,000 Units Subcutaneous Q8H  . LORazepam  1 mg Oral On Call  . mouth rinse  15 mL Mouth Rinse BID  . midodrine  10 mg Oral TID WC  . multivitamin  1 tablet Oral QHS  . vancomycin variable dose per unstable renal function (pharmacist dosing)   Does not apply See admin instructions   Assessment/Plan:  ESRD on HD - Patient on home HD 5 days per week, last session 9/9 (incomplete, ran 2 of 3 hours) - Access via left subclavian HD catheter  - Labs reviewed. Potassium 3.3, NAGMA, BUN 62, Cr 14.01, Ca 8.7 (corrected), PO4 6.0, Hgb 11.7  - Patient is on Levophen titrated to MAP of 65. Will discuss restart home Midrodrine 10 mg TID and attempting to come of pressors.  - Echocardiogram on 9/10 with blunted respirophasic changes of the IVC consistent with elevated CVP - If able to come off pressors will plan for intermittent HD  Normocytic Anemia  - Hgb 12.2 with elevated RDW  - Hgb at goal, no need for Epo with HD  Septic Shock  - Initially presented to outside hospital with tachycardia, tachypnea, and fever. Labs significant for elevated procalcitonin and lactic acid.  CT chest abdomen with ?PNA and diverticulitis. Started on pressors and given Vancomycin / Zosyn / Flagyl.  - Repeat BCs pending  - MRI of spine pending, avoid contrast  - Abx and management per PCCM   Will discuss the case further with Dr. Carolin Sicks.     LOS: 1 day   [facility] 01/16/2018,7:57 AM

## 2018-01-16 NOTE — Progress Notes (Signed)
Patient tolerating being off levophed.  Will transition to medical floor / TRH for further care.    Noe Gens, NP-C [facility] Pgr: [phone] or if no answer [phone] 01/16/2018, 2:21 PM

## 2018-01-16 NOTE — Progress Notes (Addendum)
Brandon Lowery  NID:[id number] DOB: 03-20-1966 DOA: 01/15/2018 PCP: No primary care provider on file.    LOS: 1 day   Reason for Consult / Chief Complaint:  Hypotension and back pain.  Consulting MD and date:  ED at [facility] (Dr [place])  HPI/Summary of hospital stay:  52 year old man who presented to hospital in [place] with hypotension, nausea and vomiting following home dialysis 9/9. 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  He was previously dialyzed via a fistula but was transitioned to a left subclavian tunneled catheter as his blood pressure has been too low to successfully dialyze him via a fistula.  He has been started on midodrine. Furthermore, he reports a one-week history of low back pain associated with nonspecific malaise.   Subjective:  Afebrile.  I/O - 2.4L positive for admit.  BP trend improved.  RN reports pt remains on levophed.  Pt reports cough with yellow sputum production for 2 weeks, feeling poorly, weakness and pain on outer right leg that runs down, "boils" that have popped up and gone away on his abdomen - skin never opened or broken/drainage. Also reports he has had diarrhea for about two weeks with some blood mixed in that he thought was related to hemorrhoids.    Objective   Blood pressure (!) 133/54, pulse 79, temperature 98.6 F (37 C), temperature source Oral, resp. rate (!) 8, height 5\' 11"  (1.803 m), weight 100.3 kg, SpO2 98 %.        Intake/Output Summary (Last 24 hours) at 01/16/2018 1019 Last data filed at 01/16/2018 0900 Gross per 24 hour  Intake 2450.21 ml  Output -  Net 2450.21 ml   Filed Weights   01/15/18 1000 01/15/18 1645 01/16/18 0350  Weight: 121.8 kg 100.2 kg 100.3 kg    Examination: General: adult male sitting up in chair in NAD HEENT: MM pink/moist, good dentition  Neuro: AAOx4, speech clear, MAE CV: s1s2 rrr, no m/r/g PULM: even/non-labored, lungs bilaterally clear  GI: soft, non-tender, bsx4 active  Extremities:  warm/dry, no edema  Skin: no rashes or lesions  Consults: date of consult/date signed off & final recs:  Nephrology for dialysis.  Procedures: ECHO 9/10 >> LV normal size, LVEF 60-65%, no RWMA, grade 2 diastolic dysfunction, LA mildly dilated   Significant Diagnostic Tests: Outside CT of abdomen shows sigmoid diverticulitis.  No evidence of lumbar discitis.  Micro Data: BC Angelina Sheriff) 9/9 >>  BCx2 9/10 >>   Antimicrobials:  Vancomycin (potential bacteremia) 9/10 >>  Ceftriaxone (diverticulitis) 9/10 >>  Flagyl (diverticulitis) 9/10 >>   Resolved Hospital Problem list     Assessment & Plan:   Hypotension - chronic, rule out infectious process contributing to acute change in BP, baseline systolic ~ 85.   P: Wean levophed for MAP > 60 Continue midodrine 10 mg   Sepsis - possible sources include intravascular access related infection as pt ill while on HD, discitis, URI P: Follow blood cultures  Continue broad spectrum antibiotics for now   Possible discitis - ? Radiculopathy / lumbar pathophysiology given subjective symptoms P: MRI pending.  Question if Gadolinium is dialyzed out, risk of peritoneal fibrosis.  Will need to review with Nephrology.  Pharmacy review indicates to avoid.  No obvious erosive discitis on CT from [place].  Possible diverticulitis P: Continue rocephin, flagyl  Follow cultures  Renal diet as tolerated   End-stage renal disease on hemodialysis. P: Nephrology consulted, appreciate input  Continue Phoslo, Senispar, rena-vit  DM  P: Add SSI, sensitive scale  Cough / Sputum Production  P: Assess CXR in am to ensure no evolving infiltrate  Add mucinex BID for 4 days, then d/c    Disposition / Summary of Today's Plan 01/16/18   Wean off levophed for MAP >60, continue midodrine.  Mobilize.  Consider transfer out of ICU once off pressors.   Best Practice / Goals of Care / Disposition.   DVT prophylaxis: Subcutaneous heparin GI  prophylaxis: Not required Diet: Renal diet Mobility: Ad lib. Code Status: Full code Family Communication: Patient and family update at bedside 9/11.  Labs   CBC: Recent Labs  Lab 01/15/18 1040 01/16/18 0345  WBC 37.4* 35.8*  NEUTROABS 35.2*  --   HGB 13.5 11.7*  HCT 43.9 37.0*  MCV 89.2 87.7  PLT 210 580   Basic Metabolic Panel: Recent Labs  Lab 01/15/18 1040 01/15/18 1713 01/15/18 1716 01/16/18 0345  NA 138  --  138 132*  K 3.7  --  3.7 3.3*  CL 101  --  98 97*  CO2 17*  --  21* 20*  GLUCOSE 66*  --  124* 195*  BUN 54*  --  57* 62*  CREATININE 13.54*  --  13.66* 14.01*  CALCIUM 8.2*  --  8.2* 7.8*  MG  --  1.8  --   --   PHOS  --  5.9* 6.0*  --    GFR: Estimated Creatinine Clearance: 7.4 mL/min (A) (by C-G formula based on SCr of 14.01 mg/dL (H)). Recent Labs  Lab 01/15/18 1040 01/16/18 0345  PROCALCITON >150.00  --   WBC 37.4* 35.8*   Liver Function Tests: Recent Labs  Lab 01/15/18 1040 01/15/18 1716  AST 95*  --   ALT 143*  --   ALKPHOS 71  --   BILITOT 0.7  --   PROT 6.0*  --   ALBUMIN 2.9* 2.8*   Recent Labs  Lab 01/15/18 1713  LIPASE 22   No results for input(s): AMMONIA in the last 168 hours. ABG    Component Value Date/Time   PHART 7.252 (L) 02/17/2011 0430   PCO2ART 56.2 (H) 02/17/2011 0430   PO2ART 118.0 (H) 02/17/2011 0430   HCO3 23.9 02/17/2011 0430   TCO2 22.0 02/17/2011 0430   ACIDBASEDEF 2.3 (H) 02/17/2011 0430   O2SAT 97.4 02/17/2011 0430    Coagulation Profile: Recent Labs  Lab 01/15/18 1040  INR 1.48   Cardiac Enzymes: No results for input(s): CKTOTAL, CKMB, CKMBINDEX, TROPONINI in the last 168 hours. HbA1C: Hgb A1c MFr Bld  Date/Time Value Ref Range Status  01/05/2011 12:30 PM 7.7 (H) <5.7 % Final    Comment:                                                                           According to the ADA Clinical Practice Recommendations for 2011, when HbA1c is used as a screening test:     >=6.5%    Diagnostic of Diabetes Mellitus            (if abnormal result is confirmed)   5.7-6.4%   Increased risk of developing Diabetes Mellitus   References:Diagnosis and Classification of Diabetes Mellitus,Diabetes DXIP,3825,05(LZJQB 1):S62-S69  and Standards of Medical Care in         Diabetes - 2011,Diabetes IHKV,4259,56 (Suppl 1):S11-S61.  10/01/2009 12.7 %    CBG: Recent Labs  Lab 01/15/18 0907  GLUCAP 74    Noe Gens, NP-C [facility] Pgr: [phone] or if no answer [phone] 01/16/2018, 10:19 AM

## 2018-01-16 NOTE — Progress Notes (Signed)
  Hypoglycemic Event  CBG: 63 at 18:28  Treatment: Gave pt 4 oz Orange Juice   Symptoms: None   Follow-up CBG: Time: 18:43 CBG Result:59- gave pt 4 more oz OJ. Pt dinner arrived on unit & he is eating currently   Possible Reasons for Event: Pt did not want to eat any lunch   Comments/MD notified:    Wilma Flavin

## 2018-01-16 NOTE — Progress Notes (Signed)
This RN got in nursing shift change report that MRI was not done on night shift d/t confusion with the order saying contrast, this RN spoke with Dr. Lynetta Mare and confirmed that pt does need MRI WITH contrast but MRI can wait until patient is off Levophed so that patient can receive HD after MRI in order to rid contrast.     Pt dropped P.O. meds on floor this AM, this RN had to pull meds from Pyxis again to give to patient.

## 2018-01-16 NOTE — Progress Notes (Signed)
Pharmacy Antibiotic Note  Brandon Lowery is a 52 y.o. male transferred from [place] on 01/15/2018 with PNA and possible bacteremia.  Patient presented to [facility] with AMS after two hours of dialysis.  He was also complaining of nausea, vomiting and diarrhea.  CT showed diverticulitis.  He was given vancomycin, Zosyn and Flagyl PTA.  Pharmacy has been consulted for vancomycin dosing.  [place]:   Vanc 2gm 9/9 around 1830 Zosyn 3.375gm 9/9 around 1800 and 9/10 at 0530 Flagyl 500mg  9/9 around 2300  Current antimicrobials: Vanc 9/9 >> CTX 9/10 >> Flagyl 9/10 >>  Micobiology: 9/9 BCx x2 [facility])- ngtd 9/10 MRSA PCR - negative 9/10 bcx - ngtd  Patient was given vancomycin 2 g on 9/9. Vanc random level was drawn this morning to assess vancomycin dosing requirements with dialysis. VR was therapeutic at 20 mcg/mL (goal: 15-25 mcg/mL pre-HD).  Plan: Holding off HD today with high pressor requirements - plan for HD tomorrow per nephrology If able to tolerate full session of HD tomorrow, will give vancomycin 1,000 mg post dialysis CTX 2gm IV Q24H + Flagyl 500mg  IV Q8H per MD Monitor renal fxn, clinical progress, vanc level as indicated  Height: 5\' 11"  (180.3 cm) Weight: 221 lb 1.9 oz (100.3 kg) IBW/kg (Calculated) : 75.3  Temp (24hrs), Avg:98.7 F (37.1 C), Min:98.4 F (36.9 C), Max:99 F (37.2 C)  Recent Labs  Lab 01/15/18 1040 01/15/18 1716 01/16/18 0345  WBC 37.4*  --  35.8*  CREATININE 13.54* 13.66* 14.01*  VANCORANDOM  --   --  20    Estimated Creatinine Clearance: 7.4 mL/min (A) (by C-G formula based on SCr of 14.01 mg/dL (H)).    No Known Allergies  Vertis Kelch, PharmD PGY1 Pharmacy Resident Phone [phone] 01/16/2018       10:28 AM

## 2018-01-17 ENCOUNTER — Inpatient Hospital Stay (HOSPITAL_COMMUNITY): Payer: Medicare Other

## 2018-01-17 LAB — BASIC METABOLIC PANEL
Anion gap: 16 — ABNORMAL HIGH (ref 5–15)
BUN: 76 mg/dL — AB (ref 6–20)
CO2: 19 mmol/L — ABNORMAL LOW (ref 22–32)
Calcium: 7.4 mg/dL — ABNORMAL LOW (ref 8.9–10.3)
Chloride: 99 mmol/L (ref 98–111)
Creatinine, Ser: 15.53 mg/dL — ABNORMAL HIGH (ref 0.61–1.24)
GFR calc Af Amer: 4 mL/min — ABNORMAL LOW (ref >60)
GFR, EST NON AFRICAN AMERICAN: 3 mL/min — AB (ref >60)
GLUCOSE: 119 mg/dL — AB (ref 70–99)
POTASSIUM: 3.4 mmol/L — AB (ref 3.5–5.1)
Sodium: 134 mmol/L — ABNORMAL LOW (ref 135–145)

## 2018-01-17 LAB — GLUCOSE, CAPILLARY
GLUCOSE-CAPILLARY: 55 mg/dL — AB (ref 70–99)
GLUCOSE-CAPILLARY: 95 mg/dL (ref 70–99)
Glucose-Capillary: 105 mg/dL — ABNORMAL HIGH (ref 70–99)
Glucose-Capillary: 53 mg/dL — ABNORMAL LOW (ref 70–99)
Glucose-Capillary: 111 mg/dL — ABNORMAL HIGH (ref 70–99)
Glucose-Capillary: 86 mg/dL (ref 70–99)

## 2018-01-17 LAB — CBC
HCT: 34.9 % — ABNORMAL LOW (ref 39.0–52.0)
Hemoglobin: 11.1 g/dL — ABNORMAL LOW (ref 13.0–17.0)
MCH: 27.5 pg (ref 26.0–34.0)
MCHC: 31.8 g/dL (ref 30.0–36.0)
MCV: 86.6 fL (ref 78.0–100.0)
PLATELETS: 199 10*3/uL (ref 150–400)
RBC: 4.03 MIL/uL — AB (ref 4.22–5.81)
RDW: 20.2 % — AB (ref 11.5–15.5)
WBC: 29.8 10*3/uL — ABNORMAL HIGH (ref 4.0–10.5)

## 2018-01-17 MED ORDER — HEPARIN SODIUM (PORCINE) 1000 UNIT/ML DIALYSIS: 1000.0000 [IU] | INTRAMUSCULAR | Status: DC | PRN

## 2018-01-17 MED ORDER — DEXTROSE 50 % IV SOLN
INTRAVENOUS | Status: AC
  Administered 2018-01-17: 06:00:00
  Filled 2018-01-17: qty 50

## 2018-01-17 MED ORDER — DEXTROSE 50 % IV SOLN
1.0000 | Freq: Once | INTRAVENOUS | Status: AC
  Administered 2018-01-17: 50 mL via INTRAVENOUS

## 2018-01-17 MED ORDER — HEPARIN SODIUM (PORCINE) 1000 UNIT/ML DIALYSIS: 20.0000 [IU]/kg | INTRAMUSCULAR | Status: DC | PRN

## 2018-01-17 MED ORDER — LIDOCAINE-PRILOCAINE 2.5-2.5 % EX CREA: 1.0000 "application " | TOPICAL_CREAM | CUTANEOUS | Status: DC | PRN

## 2018-01-17 MED ORDER — ALTEPLASE 2 MG IJ SOLR: 2.0000 mg | Freq: Once | INTRAMUSCULAR | Status: DC | PRN

## 2018-01-17 MED ORDER — SODIUM CHLORIDE 0.9 % IV SOLN: 100.0000 mL | INTRAVENOUS | Status: DC | PRN

## 2018-01-17 MED ORDER — LIDOCAINE HCL (PF) 1 % IJ SOLN: 5.0000 mL | INTRAMUSCULAR | Status: DC | PRN

## 2018-01-17 MED ORDER — VANCOMYCIN HCL IN DEXTROSE 1-5 GM/200ML-% IV SOLN
1000.0000 mg | Freq: Once | INTRAVENOUS | Status: AC
  Administered 2018-01-17: 1000 mg via INTRAVENOUS
  Filled 2018-01-17: qty 200

## 2018-01-17 MED ORDER — DEXTROSE 50 % IV SOLN
INTRAVENOUS | Status: AC
  Administered 2018-01-17: 50 mL via INTRAVENOUS
  Filled 2018-01-17: qty 50

## 2018-01-17 MED ORDER — PENTAFLUOROPROP-TETRAFLUOROETH EX AERO: 1.0000 "application " | INHALATION_SPRAY | CUTANEOUS | Status: DC | PRN

## 2018-01-17 NOTE — Progress Notes (Signed)
Pt sent to Dialysis per bed

## 2018-01-17 NOTE — Progress Notes (Signed)
Went in to put patient on CPAP, patient stated that he would put himself on.  RT made sure machine was set correctly and ready for patient.

## 2018-01-17 NOTE — Progress Notes (Signed)
Pt. Came to 5w after Dialysis during report at shift change.  Pt.  Looks good.  He is Alert and Oriented and walked from one bed to another during transfer.  Got Report from 9M and gave report to oncoming nurse.

## 2018-01-17 NOTE — Progress Notes (Signed)
Subjective: Interval History: Patient removes feeling nauseous today and had an episode of emesis overnight. He feels that he does not have an appetite. Continues to have subjective fevers/chills. Discussed that he may need an HD session today.   Objective: Vital signs in last 24 hours: Temp:  [98 F (36.7 C)-98.6 F (37 C)] 98.3 F (36.8 C) (09/11 2348) Pulse Rate:  [71-104] 83 (09/11 1800) Resp:  [8-31] 16 (09/12 0400) BP: (78-133)/(44-97) 100/47 (09/12 0400) SpO2:  [93 %-100 %] 99 % (09/11 1430) Weight:  [100 kg] 100 kg (09/12 0500) Weight change: -21.8 kg  Intake/Output from previous day: 09/11 0701 - 09/12 0700 In: 1763 [I.V.:1362.9; IV Piggyback:400] Out: -    Intake/Output this shift: No intake/output data recorded.  General: Obese male in no acute distress Pulm: Distant, diminished air movement with faint basilar crackles  CV: RRR, no murmurs, no rubs  Abdomen: Soft, non-distended, no tenderness to palpation  Extremities: No LE edema  Skin: Warm and dry  Neuro: Alert and oriented x 3  Lab Results: Recent Labs    01/16/18 0345 01/17/18 0315  WBC 35.8* 29.8*  HGB 11.7* 11.1*  HCT 37.0* 34.9*  PLT 219 199   BMET:  Recent Labs    01/16/18 0345 01/17/18 0315  NA 132* 134*  K 3.3* 3.4*  CL 97* 99  CO2 20* 19*  GLUCOSE 195* 119*  BUN 62* 76*  CREATININE 14.01* 15.53*  CALCIUM 7.8* 7.4*   No results for input(s): PTH in the last 72 hours.   Iron Studies: No results for input(s): IRON, TIBC, TRANSFERRIN, FERRITIN in the last 72 hours.   Studies/Results: Mr Lumbar Spine Wo Contrast  Result Date: 01/16/2018 CLINICAL DATA:  Diabetes and end-stage renal disease. Sepsis. Possible infection. EXAM: MRI LUMBAR SPINE WITHOUT CONTRAST TECHNIQUE: Multiplanar, multisequence MR imaging of the lumbar spine was performed. No intravenous contrast was administered. COMPARISON:  None. FINDINGS: Segmentation: The lowest lumbar type non-rib-bearing vertebra is labeled as L5.  Alignment:  No vertebral subluxation is observed. Vertebrae: Schmorl's node along the superior endplate of L5. Faintly accentuated T2 signal centrally in the L5 vertebral disc. Small Schmorl's nodes along the inferior endplate of L3 and possibly L4. Conus medullaris and cauda equina: Conus extends to the L1-2 level. The conus appears normal. 1 of the right nerve roots of the cauda equina extends anteriorly on image 9/3 in an unusual fashion, possibly suggesting some very minimal anterior tethering. However, there is no additional nerve root clumping or thickening to suggest arachnoiditis. Paraspinal and other soft tissues: Atrophic kidneys with multiple complex left renal cysts. Possible wall thickening in the rectum, versus nondistention. No paraspinal abscess. Disc levels: L1-2: Unremarkable. L2-3: Unremarkable. L3-4: No impingement.  Mild facet arthropathy. L4-5: Mild bilateral foraminal stenosis due to disc bulge and facet arthropathy. L5-S1: Mild right and borderline left foraminal stenosis due to uncinate and facet spurring. Sacroiliac joints: There is some accentuated T1 and T2 signal along the sacroiliac joints suggesting bilateral chronic sacroiliitis. IMPRESSION: 1. There is some focal accentuated T2 signal centrally in the L5 intervertebral disc. This is a tiny amount of increased signal for example on image 9/4, and is not thought to be specific for discitis. There appears to be an adjacent chronic Schmorl's node with accentuated T1 signal along its margin in the superior endplate of L5. Given the high T1 signal I am skeptical that this represents an infectious process. There is no paraspinal edema at this level. No findings of epidural abscess. 2.  Mild impingement at L4-5 at L5-S1 due to degenerative disc disease and spondylosis. 3. 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 4. Wall thickening in the rectum may be due to nondistention. 5. Chronic bilateral sacroiliitis.  Electronically Signed   By: Van Clines M.D.   On: 01/16/2018 18:08   Ct Outside Films Body  Result Date: 01/15/2018 This examination belongs to an outside facility and is stored here for comparison purposes only.  Contact the originating outside institution for any associated report or interpretation.  Scheduled: . aspirin EC  81 mg Oral Daily  . calcium acetate  667 mg Oral TID WC  . Chlorhexidine Gluconate Cloth  6 each Topical Q0600  . Chlorhexidine Gluconate Cloth  6 each Topical Q0600  . cinacalcet  60 mg Oral Q breakfast  . fluticasone  1 spray Each Nare Daily  . guaiFENesin  1,200 mg Oral BID  . heparin  5,000 Units Subcutaneous Q8H  . insulin aspart  0-5 Units Subcutaneous QHS  . insulin aspart  0-9 Units Subcutaneous TID WC  . mouth rinse  15 mL Mouth Rinse BID  . midodrine  10 mg Oral TID WC  . multivitamin  1 tablet Oral QHS  . vancomycin variable dose per unstable renal function (pharmacist dosing)   Does not apply See admin instructions   Assessment/Plan:  ESRD on HD - Patient on home HD 5 days per week, last session 9/9 (incomplete, ran 2 of 3 hours) - Access via left subclavian HD catheter  - Labs reviewed. Potassium 3.4, NAGMA, BUN 76, Cr 15.53, Ca 8.7 (corrected), PO4 6.0, Hgb 11.1  - Off pressors since 12:52 on 9/11 - Is having some uremic symptoms and may benefit from HD today   Normocytic Anemia  - Hgb 11.1 with elevated RDW  - Hgb at goal, no need for Epo with HD  Septic Shock  - Initially presented to outside hospital with tachycardia, tachypnea, and fever. Labs significant for elevated procalcitonin and lactic acid. CT chest abdomen with ?PNA and diverticulitis. Started on pressors and given Vancomycin / Zosyn / Flagyl.  - BC with no growth to date - MRI of spine without definitive evidence of infection  - Abx and management per PCCM   Will discuss the case further with Dr. Carolin Sicks.       LOS: 2 days   [facility] 01/17/2018,6:45  AM

## 2018-01-17 NOTE — Progress Notes (Signed)
Called report to [facility] [employer] [facility] - pt will transfer to [address] bed 36 after dialysis

## 2018-01-18 LAB — CBC
HCT: 34.1 % — ABNORMAL LOW (ref 39.0–52.0)
HEMOGLOBIN: 11.1 g/dL — AB (ref 13.0–17.0)
MCH: 27.6 pg (ref 26.0–34.0)
MCHC: 32.6 g/dL (ref 30.0–36.0)
MCV: 84.8 fL (ref 78.0–100.0)
Platelets: 200 10*3/uL (ref 150–400)
RBC: 4.02 MIL/uL — ABNORMAL LOW (ref 4.22–5.81)
RDW: 20.2 % — AB (ref 11.5–15.5)
WBC: 11.6 10*3/uL — ABNORMAL HIGH (ref 4.0–10.5)

## 2018-01-18 LAB — RENAL FUNCTION PANEL
ALBUMIN: 2.6 g/dL — AB (ref 3.5–5.0)
Anion gap: 11 (ref 5–15)
BUN: 41 mg/dL — ABNORMAL HIGH (ref 6–20)
CALCIUM: 7.2 mg/dL — AB (ref 8.9–10.3)
CO2: 26 mmol/L (ref 22–32)
CREATININE: 11.35 mg/dL — AB (ref 0.61–1.24)
Chloride: 99 mmol/L (ref 98–111)
GFR calc Af Amer: 5 mL/min — ABNORMAL LOW (ref >60)
GFR calc non Af Amer: 4 mL/min — ABNORMAL LOW (ref >60)
GLUCOSE: 74 mg/dL (ref 70–99)
Phosphorus: 3.3 mg/dL (ref 2.5–4.6)
Potassium: 3.4 mmol/L — ABNORMAL LOW (ref 3.5–5.1)
SODIUM: 136 mmol/L (ref 135–145)

## 2018-01-18 LAB — GLUCOSE, CAPILLARY
GLUCOSE-CAPILLARY: 104 mg/dL — AB (ref 70–99)
Glucose-Capillary: 68 mg/dL — ABNORMAL LOW (ref 70–99)
Glucose-Capillary: 97 mg/dL (ref 70–99)
Glucose-Capillary: 65 mg/dL — ABNORMAL LOW (ref 70–99)

## 2018-01-18 MED ORDER — CALCIUM CARBONATE ANTACID 500 MG PO CHEW
2.0000 | CHEWABLE_TABLET | Freq: Two times a day (BID) | ORAL | Status: DC
  Administered 2018-01-18: 400 mg via ORAL
  Filled 2018-01-18: qty 2

## 2018-01-18 MED ORDER — CALCIUM CARBONATE ANTACID 500 MG PO CHEW: 2.0000 | CHEWABLE_TABLET | Freq: Three times a day (TID) | ORAL | Status: DC

## 2018-01-18 MED ORDER — CHLORHEXIDINE GLUCONATE CLOTH 2 % EX PADS: 6.0000 | MEDICATED_PAD | Freq: Every day | CUTANEOUS | Status: DC

## 2018-01-18 NOTE — Care Management Important Message (Signed)
Important Message  Patient Details  Name: Brandon Lowery MRN: [id number] Date of Birth: 1965/09/07   Medicare Important Message Given:  Yes    [name] 01/18/2018, 4:02 PM

## 2018-01-18 NOTE — Progress Notes (Addendum)
Subjective: Interval History: Patient doing well this AM. He tolerated HD yesterday without issues. We discussed that we will continue HD while he is here. If primary feels he is ready for discharge the patient can continue his home HD schedule and will need to follow-up with his nephrologist.   Objective: Vital signs in last 24 hours: Temp:  [97.4 F (36.3 C)-98 F (36.7 C)] 98 F (36.7 C) (09/13 0525) Pulse Rate:  [63-80] 80 (09/13 0525) Resp:  [0-34] 19 (09/13 0525) BP: (108-156)/(60-105) 120/84 (09/13 0525) SpO2:  [95 %-99 %] 98 % (09/13 0525) Weight:  [123.8 kg] 123.8 kg (09/12 1855) Weight change: 23.8 kg  Intake/Output from previous day: 09/12 0701 - 09/13 0700 In: 410 [P.O.:350; I.V.:60] Out: 0    Intake/Output this shift: No intake/output data recorded.  General: Obese male in no acute distress Pulm: Good air movement with no wheezing or crackles  CV: RRR, no murmurs, no rubs  Abdomen: Active bowel sounds, soft, non-distended, no tenderness to palpation  Extremities: No LE edema   Lab Results: Recent Labs    01/16/18 0345 01/17/18 0315  WBC 35.8* 29.8*  HGB 11.7* 11.1*  HCT 37.0* 34.9*  PLT 219 199   BMET:  Recent Labs    01/16/18 0345 01/17/18 0315  NA 132* 134*  K 3.3* 3.4*  CL 97* 99  CO2 20* 19*  GLUCOSE 195* 119*  BUN 62* 76*  CREATININE 14.01* 15.53*  CALCIUM 7.8* 7.4*   No results for input(s): PTH in the last 72 hours.   Iron Studies: No results for input(s): IRON, TIBC, TRANSFERRIN, FERRITIN in the last 72 hours.   Studies/Results: Mr Lumbar Spine Wo Contrast  Result Date: 01/16/2018 CLINICAL DATA:  Diabetes and end-stage renal disease. Sepsis. Possible infection. EXAM: MRI LUMBAR SPINE WITHOUT CONTRAST TECHNIQUE: Multiplanar, multisequence MR imaging of the lumbar spine was performed. No intravenous contrast was administered. COMPARISON:  None. FINDINGS: Segmentation: The lowest lumbar type non-rib-bearing vertebra is labeled as L5.  Alignment:  No vertebral subluxation is observed. Vertebrae: Schmorl's node along the superior endplate of L5. Faintly accentuated T2 signal centrally in the L5 vertebral disc. Small Schmorl's nodes along the inferior endplate of L3 and possibly L4. Conus medullaris and cauda equina: Conus extends to the L1-2 level. The conus appears normal. 1 of the right nerve roots of the cauda equina extends anteriorly on image 9/3 in an unusual fashion, possibly suggesting some very minimal anterior tethering. However, there is no additional nerve root clumping or thickening to suggest arachnoiditis. Paraspinal and other soft tissues: Atrophic kidneys with multiple complex left renal cysts. Possible wall thickening in the rectum, versus nondistention. No paraspinal abscess. Disc levels: L1-2: Unremarkable. L2-3: Unremarkable. L3-4: No impingement.  Mild facet arthropathy. L4-5: Mild bilateral foraminal stenosis due to disc bulge and facet arthropathy. L5-S1: Mild right and borderline left foraminal stenosis due to uncinate and facet spurring. Sacroiliac joints: There is some accentuated T1 and T2 signal along the sacroiliac joints suggesting bilateral chronic sacroiliitis. IMPRESSION: 1. There is some focal accentuated T2 signal centrally in the L5 intervertebral disc. This is a tiny amount of increased signal for example on image 9/4, and is not thought to be specific for discitis. There appears to be an adjacent chronic Schmorl's node with accentuated T1 signal along its margin in the superior endplate of L5. Given the high T1 signal I am skeptical that this represents an infectious process. There is no paraspinal edema at this level. No findings of epidural  abscess. 2. Mild impingement at L4-5 at L5-S1 due to degenerative disc disease and spondylosis. 3. 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 4. Wall thickening in the rectum may be due to nondistention. 5. Chronic bilateral sacroiliitis.  Electronically Signed   By: Van Clines M.D.   On: 01/16/2018 18:08   Dg Chest Port 1 View  Result Date: 01/17/2018 CLINICAL DATA:  cough EXAM: PORTABLE CHEST 1 VIEW COMPARISON:  01/14/2018 FINDINGS: Patient has LEFT-sided dialysis catheter with tips to the superior vena cava-RIGHT atrium and superior vena cava. The heart size is accentuated by portable technique and is probably normal. Mildly prominent interstitial markings favor mild pulmonary edema. No overt edema or consolidations. Surgical clips are present in the LEFT UPPER QUADRANT the abdomen. IMPRESSION: Mild interstitial edema. Electronically Signed   By: Nolon Nations M.D.   On: 01/17/2018 09:48   Scheduled: . aspirin EC  81 mg Oral Daily  . calcium acetate  667 mg Oral TID WC  . Chlorhexidine Gluconate Cloth  6 each Topical Q0600  . Chlorhexidine Gluconate Cloth  6 each Topical Q0600  . cinacalcet  60 mg Oral Q breakfast  . fluticasone  1 spray Each Nare Daily  . guaiFENesin  1,200 mg Oral BID  . heparin  5,000 Units Subcutaneous Q8H  . insulin aspart  0-5 Units Subcutaneous QHS  . insulin aspart  0-9 Units Subcutaneous TID WC  . mouth rinse  15 mL Mouth Rinse BID  . midodrine  10 mg Oral TID WC  . multivitamin  1 tablet Oral QHS  . vancomycin variable dose per unstable renal function (pharmacist dosing)   Does not apply See admin instructions   Assessment/Plan:  ESRD on HD - Patient on home HD 5 days per week, last session 9/9 (incomplete, ran 2 of 3 hours) - Access via left subclavian HD catheter  -Today's labs pending  - HD yesterday, no fluid removed  Normocytic Anemia  - Hgb 11.1 with elevated RDW  - Hgb at goal, no need for Epo with HD  Septic Shock  - Initially presented to outside hospital with tachycardia, tachypnea, and fever. Labs significant for elevated procalcitonin and lactic acid. CT chest abdomen with ?PNA and diverticulitis. Started on pressors and given Vancomycin / Zosyn / Flagyl.  - BC  with no growth to date - MRI of spine without definitive evidence of infection  - Abx and management per primary  Will discuss the case further with Dr. Carolin Sicks.    LOS: 3 days   Ericia Moxley 01/18/2018,6:40 AM

## 2018-01-18 NOTE — Progress Notes (Signed)
RT checked with patient about CPAP, patient stated that he would manage it hisself.  RT placed CPAP near bed and made sure it was ready for patient.

## 2018-01-19 DIAGNOSIS — N189 Chronic kidney disease, unspecified: Secondary | ICD-10-CM

## 2018-01-19 LAB — GLUCOSE, CAPILLARY
Glucose-Capillary: 78 mg/dL (ref 70–99)
Glucose-Capillary: 69 mg/dL — ABNORMAL LOW (ref 70–99)

## 2018-01-19 LAB — CBC
HCT: 41.7 % (ref 39.0–52.0)
Hemoglobin: 13.2 g/dL (ref 13.0–17.0)
MCH: 27.4 pg (ref 26.0–34.0)
MCHC: 31.7 g/dL (ref 30.0–36.0)
MCV: 86.5 fL (ref 78.0–100.0)
PLATELETS: 201 10*3/uL (ref 150–400)
RBC: 4.82 MIL/uL (ref 4.22–5.81)
RDW: 20.4 % — ABNORMAL HIGH (ref 11.5–15.5)
WBC: 5 10*3/uL (ref 4.0–10.5)

## 2018-01-19 LAB — RENAL FUNCTION PANEL
Albumin: 3.3 g/dL — ABNORMAL LOW (ref 3.5–5.0)
Anion gap: 17 — ABNORMAL HIGH (ref 5–15)
BUN: 21 mg/dL — ABNORMAL HIGH (ref 6–20)
CALCIUM: 7.8 mg/dL — AB (ref 8.9–10.3)
CO2: 24 mmol/L (ref 22–32)
CREATININE: 8.04 mg/dL — AB (ref 0.61–1.24)
Chloride: 94 mmol/L — ABNORMAL LOW (ref 98–111)
GFR calc Af Amer: 8 mL/min — ABNORMAL LOW (ref >60)
GFR calc non Af Amer: 7 mL/min — ABNORMAL LOW (ref >60)
GLUCOSE: 76 mg/dL (ref 70–99)
Phosphorus: 2.2 mg/dL — ABNORMAL LOW (ref 2.5–4.6)
Potassium: 3.2 mmol/L — ABNORMAL LOW (ref 3.5–5.1)
SODIUM: 135 mmol/L (ref 135–145)

## 2018-01-19 MED ORDER — HEPARIN SODIUM (PORCINE) 1000 UNIT/ML DIALYSIS: 1000.0000 [IU] | INTRAMUSCULAR | Status: DC | PRN

## 2018-01-19 MED ORDER — LIDOCAINE HCL (PF) 1 % IJ SOLN: 5.0000 mL | INTRAMUSCULAR | Status: DC | PRN

## 2018-01-19 MED ORDER — PENTAFLUOROPROP-TETRAFLUOROETH EX AERO: 1.0000 "application " | INHALATION_SPRAY | CUTANEOUS | Status: DC | PRN

## 2018-01-19 MED ORDER — SODIUM CHLORIDE 0.9 % IV SOLN: 100.0000 mL | INTRAVENOUS | Status: DC | PRN

## 2018-01-19 MED ORDER — HEPARIN SODIUM (PORCINE) 1000 UNIT/ML DIALYSIS: 20.0000 [IU]/kg | INTRAMUSCULAR | Status: DC | PRN

## 2018-01-19 MED ORDER — VANCOMYCIN HCL IN DEXTROSE 1-5 GM/200ML-% IV SOLN
1000.0000 mg | Freq: Once | INTRAVENOUS | Status: DC
  Filled 2018-01-19: qty 200

## 2018-01-19 MED ORDER — APIXABAN 2.5 MG PO TABS: 2.5000 mg | ORAL_TABLET | Freq: Two times a day (BID) | ORAL | 0 refills | Status: AC

## 2018-01-19 MED ORDER — CINACALCET HCL 30 MG PO TABS: 60.0000 mg | ORAL_TABLET | Freq: Every day | ORAL | 3 refills | Status: AC

## 2018-01-19 MED ORDER — AMOXICILLIN-POT CLAVULANATE 500-125 MG PO TABS
500.0000 mg | ORAL_TABLET | Freq: Once | ORAL | Status: AC
  Administered 2018-01-19: 500 mg via ORAL
  Filled 2018-01-19: qty 1

## 2018-01-19 MED ORDER — SODIUM CHLORIDE 0.9% FLUSH: 10.0000 mL | INTRAVENOUS | Status: DC | PRN

## 2018-01-19 MED ORDER — RENA-VITE PO TABS: 1.0000 | ORAL_TABLET | Freq: Every day | ORAL | 0 refills | Status: DC

## 2018-01-19 MED ORDER — MIDODRINE HCL 10 MG PO TABS: 10.0000 mg | ORAL_TABLET | Freq: Two times a day (BID) | ORAL | 3 refills | Status: AC

## 2018-01-19 MED ORDER — LIDOCAINE-PRILOCAINE 2.5-2.5 % EX CREA: 1.0000 "application " | TOPICAL_CREAM | CUTANEOUS | Status: DC | PRN

## 2018-01-19 MED ORDER — AMOXICILLIN-POT CLAVULANATE 500-125 MG PO TABS: 500.0000 mg | ORAL_TABLET | Freq: Every day | ORAL | 0 refills | Status: AC

## 2018-01-19 MED ORDER — ALTEPLASE 2 MG IJ SOLR: 2.0000 mg | Freq: Once | INTRAMUSCULAR | Status: DC | PRN

## 2018-01-19 NOTE — Discharge Summary (Signed)
Physician Discharge Summary  Patient ID: Brandon Lowery MRN: [id number] DOB/AGE: 1965/06/09 52 y.o.  Admit date: 01/15/2018 Discharge date: 01/19/2018    Discharge Diagnoses:  Hypotension  Severe Sepsis in setting of Diverticulitis  Diverticulitis  ESRD on HD  DM  Cough  Low Back Pain - Discitis ruled out                                                                       DISCHARGE PLAN BY DIAGNOSIS     Hypotension  Severe Sepsis in setting of Diverticulitis   Discharge Plan: Continue midodrine 10 mg BID Complete augmentin as below  Follow up with PCP / primary nephrology regarding BP control  Diverticulitis   Discharge Plan: Augmentin 500 mg QD x2 more days.  Pt instructed to take after HD.  Reviewed dosing with pharmacy for renal dosing.    ESRD on HD   Discharge Plan: Pt reports he is on Eliquis to keep his HD catheter from clotting.  Resume at renal dosing.  Follow up with Nephrology post discharge.  Resume home hemodialysis as previously prescribed  Nephrology reviewed Eliquis dosing and stopped PhosLo at [facility]   DM   Discharge Plan: Continue glimepride  Monitor glucose Follow up with Primary MD   Cough   Discharge Plan: Resolved, no acute follow up   Low Back Pain - Discitis ruled out   Discharge Plan: Follow up with PCP                       DISCHARGE SUMMARY   52 y/o M who presented to [facility] 9/9 with reports of nausea, vomiting and hypotension following home dialysis.  He has a hx of ESRD on HD due to hypertensive nephropathy.  He was previously dialyzed via AV fistula but was transitioned to left subclavian tunneled catheter due to low blood pressure during HD / inability to dialyze via AF fistula.  Additionally, he reported a two week history of diarrhea with some blood mixed in stool, malaise and low back pain.  He was admitted to ICU and placed on levophed for hypotension. He was given IVF and empiric antibiotics for  infection.  ECHO demonstrated normal LV with LVEF of 60-65%, no wall motion abnormalities, grade 2 diastolic dysfunction.  MRI of the lumbar spine completed (without gadolinium in the setting of renal failure) which did not definitively show evidence for discitis but did show degenerative disc disease and spondylosis.  The patient remained stable on antibiotics, tolerated hemodialysis and BP was stable.  He was medically cleared on 9/14 with plans as above.           SIGNIFICANT DIAGNOSTIC STUDIES [place] CT ABD >> sigmoid diverticulitis, no evidence of lumbar discitis  ECHO 9/10 >> LV normal size, LVEF 60-65%, no RWMA, grade 2 diastolic dysfunction, LA mildly dilated  MRI Lumbar Spine 9/11 >> There is some focal accentuated T2 signal centrally in the L5 intervertebral disc. This is a tiny amount of increased signal for example on image 9/4, and is not thought to be specific for discitis. There appears to be an adjacent chronic Schmorl's node with accentuated T1 signal along its margin in the superior endplate of L5. Given the  high T1 signal I am skeptical that this represents an infectious process. There is no paraspinal edema at this level. No findings of epidural abscess. Mild impingement at L4-5 at L5-S1 due to degenerative disc disease and spondylosis. 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 Wall thickening in the rectum may be due to nondistention. Chronic bilateral sacroiliitis.  MICRO DATA  BCx2 ([place]) 9/9 >>  BCx2 9/10 >>  ANTIBIOTICS Vancomycin (potential bacteremia) 9/10 >>  Ceftriaxone (diverticulitis) 9/10 >>  Flagyl (diverticulitis) 9/10 >>   LINES L Femoral TLC 9/9 >> 9/14   Discharge Exam: General: adult male in NAD Neuro: AAOx4, speech clear, MAE CV: s1s2 RRR, no m/r/g  PULM: even / non-labored, lungs bilaterally clear / without wheeze  GI: soft/non-tender, bsx4 active  Extremities: warm/dry, no edema.  Evorn Gong cath clean /dry / intact.     Vitals:   01/19/18 1100 01/19/18 1130 01/19/18 1146 01/19/18 1351  BP: (!) 143/83 (!) 151/81 (!) 140/92 (!) 121/95  Pulse: 71 67 66 73  Resp:   18 20  Temp:   97.9 F (36.6 C) 97.9 F (36.6 C)  TempSrc:   Oral Oral  SpO2:   96% 100%  Weight:   120.9 kg   Height:         Discharge Labs  BMET Recent Labs  Lab 01/15/18 1713 01/15/18 1716 01/16/18 0345 01/17/18 0315 01/18/18 0751 01/19/18 1306  NA  --  138 132* 134* 136 135  K  --  3.7 3.3* 3.4* 3.4* 3.2*  CL  --  98 97* 99 99 94*  CO2  --  21* 20* 19* 26 24  GLUCOSE  --  124* 195* 119* 74 76  BUN  --  57* 62* 76* 41* 21*  CREATININE  --  13.66* 14.01* 15.53* 11.35* 8.04*  CALCIUM  --  8.2* 7.8* 7.4* 7.2* 7.8*  MG 1.8  --   --   --   --   --   PHOS 5.9* 6.0*  --   --  3.3 2.2*    CBC Recent Labs  Lab 01/17/18 0315 01/18/18 0751 01/19/18 1306  HGB 11.1* 11.1* 13.2  HCT 34.9* 34.1* 41.7  WBC 29.8* 11.6* 5.0  PLT 199 200 201    Anti-Coagulation Recent Labs  Lab 01/15/18 1040  INR 1.48    Discharge Instructions    Call MD for:  difficulty breathing, headache or visual disturbances   Complete by:  As directed    Call MD for:  extreme fatigue   Complete by:  As directed    Call MD for:  hives   Complete by:  As directed    Call MD for:  persistant dizziness or light-headedness   Complete by:  As directed    Call MD for:  persistant nausea and vomiting   Complete by:  As directed    Call MD for:  redness, tenderness, or signs of infection (pain, swelling, redness, odor or green/yellow discharge around incision site)   Complete by:  As directed    Call MD for:  severe uncontrolled pain   Complete by:  As directed    Call MD for:  temperature >100.4   Complete by:  As directed    Diet Carb Modified   Complete by:  As directed    Carbohydrate modified, renal diet   Discharge instructions   Complete by:  As directed    1.  Establish care with a primary MD in  your area.  You need to follow up  regarding your back pain with your primary.   2.  Follow up with Nephrology regarding your Eliquis use, dose adjusted to 2.5 mg BID due to your renal failure.  3.  Review your medications carefully as they have changed.  4.  Complete your antibiotics as prescribed.  First dose on 9/15.  Last dose on 9/16 > after hemodialysis  5.  Call if you have new or worsening symptoms or report to the ER.   Increase activity slowly   Complete by:  As directed       Allergies as of 01/19/2018   No Known Allergies     Medication List    STOP taking these medications   albuterol 108 (90 Base) MCG/ACT inhaler Commonly known as:  PROVENTIL HFA;VENTOLIN HFA   amLODipine 10 MG tablet Commonly known as:  NORVASC   atenolol 50 MG tablet Commonly known as:  TENORMIN   calcium acetate 667 MG capsule Commonly known as:  PHOSLO   cloNIDine 0.1 MG tablet Commonly known as:  CATAPRES   fluticasone 110 MCG/ACT inhaler Commonly known as:  FLOVENT HFA   Fluticasone-Salmeterol 100-50 MCG/DOSE Aepb Commonly known as:  ADVAIR   furosemide 40 MG tablet Commonly known as:  LASIX   hydrALAZINE 25 MG tablet Commonly known as:  APRESOLINE   Pitavastatin Calcium 4 MG Tabs     TAKE these medications   amoxicillin-clavulanate 500-125 MG tablet Commonly known as:  AUGMENTIN Take 1 tablet (500 mg total) by mouth daily. Take after Hemodialysis. Can take daily otherwise until gone.   apixaban 2.5 MG Tabs tablet Commonly known as:  ELIQUIS Take 1 tablet (2.5 mg total) by mouth 2 (two) times daily. What changed:    medication strength  how much to take   aspirin EC 81 MG tablet Take 81 mg by mouth daily.   cinacalcet 30 MG tablet Commonly known as:  SENSIPAR Take 2 tablets (60 mg total) by mouth daily with breakfast.   gabapentin 600 MG tablet Commonly known as:  NEURONTIN Take 600-1,200 mg by mouth See admin instructions. 625m in the morning and at noon, then take 12011mat bedtime    glimepiride 4 MG tablet Commonly known as:  AMARYL Take 1 tablet (4 mg total) by mouth daily before breakfast.   midodrine 10 MG tablet Commonly known as:  PROAMATINE Take 1 tablet (10 mg total) by mouth 2 times daily at 12 noon and 4 pm. What changed:  when to take this   multivitamin Tabs tablet Take 1 tablet by mouth at bedtime.   traZODone 100 MG tablet Commonly known as:  DESYREL Take 100 mg by mouth at bedtime.        Disposition:  Home.  No new home health needs identified.   Discharged Condition: Brandon LOBELLOas met maximum benefit of inpatient care and is medically stable and cleared for discharge.  Patient is pending follow up as above.      Time spent on disposition:  40 minutes  Signed: BrNoe GensNP-C [facility] Pgr: [phone] Office: [phone]

## 2018-01-19 NOTE — Progress Notes (Signed)
Pt given discharge instructions, prescriptions, and care notes. Pt verbalized understanding AEB no further questions or concerns at this time. IV was discontinued, no redness, pain, or swelling noted at this time. Telemetry discontinued and Centralized Telemetry was notified. Pt left the floor via wheelchair with staff in stable condition. 

## 2018-01-19 NOTE — Procedures (Signed)
Patient was seen on dialysis and the procedure was supervised.  BFR 400  Via TDC BP is 141/80  .   Patient appears to be tolerating treatment well. Diarrhea is improved.  Patient to resume outpatient HD on discharge.  Dron Tanna Furry 01/19/2018

## 2018-01-20 LAB — CULTURE, BLOOD (ROUTINE X 2)
CULTURE: NO GROWTH
Culture: NO GROWTH

## 2018-01-21 ENCOUNTER — Other Ambulatory Visit: Payer: Self-pay | Admitting: Pulmonary Disease

## 2018-01-21 NOTE — Progress Notes (Signed)
Received a call from [place], RN on 5w at [facility] - pt called stating none of his prescriptions were sent into the pharmacy.  I personally called [facility] in [place] and spoke with Nicki Reaper who verified his prescriptions were filled on 9/14 and waiting to be picked up.  Attempted to call the numbers listed in patients chart without success / no answer.  Called Schuyler Amor (family) back at [phone] and informed her medications are available.  She indicates understanding.     Noe Gens, NP-C [facility] Pgr: [phone] or if no answer [phone] 01/21/2018, 2:25 PM

## 2018-02-10 ENCOUNTER — Other Ambulatory Visit: Payer: Self-pay | Admitting: Pulmonary Disease

## 2018-03-31 ENCOUNTER — Other Ambulatory Visit: Payer: Self-pay | Admitting: Pulmonary Disease

## 2018-12-04 ENCOUNTER — Other Ambulatory Visit: Payer: Self-pay | Admitting: Pulmonary Disease

## 2019-06-19 ENCOUNTER — Other Ambulatory Visit (HOSPITAL_COMMUNITY): Payer: Self-pay | Admitting: Nephrology

## 2019-06-19 DIAGNOSIS — N186 End stage renal disease: Secondary | ICD-10-CM

## 2019-06-20 ENCOUNTER — Other Ambulatory Visit: Payer: Self-pay | Admitting: Physician Assistant

## 2019-06-20 ENCOUNTER — Other Ambulatory Visit (HOSPITAL_COMMUNITY): Payer: Self-pay | Admitting: Nephrology

## 2019-06-20 ENCOUNTER — Ambulatory Visit (HOSPITAL_COMMUNITY)
Admission: RE | Admit: 2019-06-20 | Discharge: 2019-06-20 | Disposition: A | Discharge status: Home / Self Care | Payer: Medicare PPO | Source: Ambulatory Visit | Attending: Nephrology | Admitting: Nephrology

## 2019-06-20 ENCOUNTER — Other Ambulatory Visit: Payer: Self-pay

## 2019-06-20 DIAGNOSIS — J45909 Unspecified asthma, uncomplicated: Secondary | ICD-10-CM | POA: Insufficient documentation

## 2019-06-20 DIAGNOSIS — Z7982 Long term (current) use of aspirin: Secondary | ICD-10-CM | POA: Insufficient documentation

## 2019-06-20 DIAGNOSIS — N186 End stage renal disease: Secondary | ICD-10-CM | POA: Diagnosis not present

## 2019-06-20 DIAGNOSIS — T8241XA Breakdown (mechanical) of vascular dialysis catheter, initial encounter: Secondary | ICD-10-CM | POA: Insufficient documentation

## 2019-06-20 DIAGNOSIS — Z6830 Body mass index (BMI) 30.0-30.9, adult: Secondary | ICD-10-CM | POA: Insufficient documentation

## 2019-06-20 DIAGNOSIS — I120 Hypertensive chronic kidney disease with stage 5 chronic kidney disease or end stage renal disease: Secondary | ICD-10-CM | POA: Diagnosis not present

## 2019-06-20 DIAGNOSIS — Y841 Kidney dialysis as the cause of abnormal reaction of the patient, or of later complication, without mention of misadventure at the time of the procedure: Secondary | ICD-10-CM | POA: Diagnosis not present

## 2019-06-20 DIAGNOSIS — Z7901 Long term (current) use of anticoagulants: Secondary | ICD-10-CM | POA: Diagnosis not present

## 2019-06-20 DIAGNOSIS — Z992 Dependence on renal dialysis: Secondary | ICD-10-CM | POA: Diagnosis not present

## 2019-06-20 DIAGNOSIS — E1122 Type 2 diabetes mellitus with diabetic chronic kidney disease: Secondary | ICD-10-CM | POA: Insufficient documentation

## 2019-06-20 DIAGNOSIS — Z79899 Other long term (current) drug therapy: Secondary | ICD-10-CM | POA: Insufficient documentation

## 2019-06-20 HISTORY — PX: IR VENO/JUGULAR RIGHT: IMG2275

## 2019-06-20 HISTORY — PX: IR FLUORO GUIDE CV LINE LEFT: IMG2282

## 2019-06-20 HISTORY — PX: IR US GUIDE VASC ACCESS RIGHT: IMG2390

## 2019-06-20 LAB — GLUCOSE, CAPILLARY: Glucose-Capillary: 72 mg/dL (ref 70–99)

## 2019-06-20 LAB — BASIC METABOLIC PANEL
Anion gap: 17 — ABNORMAL HIGH (ref 5–15)
BUN: 46 mg/dL — ABNORMAL HIGH (ref 6–20)
CO2: 24 mmol/L (ref 22–32)
Calcium: 9.4 mg/dL (ref 8.9–10.3)
Chloride: 96 mmol/L — ABNORMAL LOW (ref 98–111)
Creatinine, Ser: 18.13 mg/dL — ABNORMAL HIGH (ref 0.61–1.24)
GFR calc Af Amer: 3 mL/min — ABNORMAL LOW (ref >60)
GFR calc non Af Amer: 3 mL/min — ABNORMAL LOW (ref >60)
Glucose, Bld: 87 mg/dL (ref 70–99)
Potassium: 3.2 mmol/L — ABNORMAL LOW (ref 3.5–5.1)
Sodium: 137 mmol/L (ref 135–145)

## 2019-06-20 LAB — PROTIME-INR
INR: 1.7 — ABNORMAL HIGH (ref 0.8–1.2)
Prothrombin Time: 20.1 seconds — ABNORMAL HIGH (ref 11.4–15.2)

## 2019-06-20 LAB — CBC
HCT: 41.1 % (ref 39.0–52.0)
Hemoglobin: 12.3 g/dL — ABNORMAL LOW (ref 13.0–17.0)
MCH: 26.3 pg (ref 26.0–34.0)
MCHC: 29.9 g/dL — ABNORMAL LOW (ref 30.0–36.0)
MCV: 87.8 fL (ref 80.0–100.0)
Platelets: 247 10*3/uL (ref 150–400)
RBC: 4.68 MIL/uL (ref 4.22–5.81)
RDW: 20.6 % — ABNORMAL HIGH (ref 11.5–15.5)
WBC: 7.4 10*3/uL (ref 4.0–10.5)
nRBC: 0 % (ref 0.0–0.2)

## 2019-06-20 MED ORDER — CEFAZOLIN SODIUM-DEXTROSE 2-4 GM/100ML-% IV SOLN
2.0000 g | Freq: Once | INTRAVENOUS | Status: AC
  Administered 2019-06-20: 11:00:00 2 g via INTRAVENOUS

## 2019-06-20 MED ORDER — MIDAZOLAM HCL 2 MG/2ML IJ SOLN
INTRAMUSCULAR | Status: AC | PRN
  Administered 2019-06-20: 0.5 mg via INTRAVENOUS
  Administered 2019-06-20: 1 mg via INTRAVENOUS

## 2019-06-20 MED ORDER — SODIUM CHLORIDE 0.9 % IV SOLN: INTRAVENOUS | Status: DC

## 2019-06-20 MED ORDER — CHLORHEXIDINE GLUCONATE 4 % EX LIQD
CUTANEOUS | Status: AC
  Filled 2019-06-20: qty 15

## 2019-06-20 MED ORDER — LIDOCAINE HCL 1 % IJ SOLN
INTRAMUSCULAR | Status: AC
  Filled 2019-06-20: qty 20

## 2019-06-20 MED ORDER — CEFAZOLIN SODIUM-DEXTROSE 2-4 GM/100ML-% IV SOLN
INTRAVENOUS | Status: AC
  Filled 2019-06-20: qty 100

## 2019-06-20 MED ORDER — IOHEXOL 300 MG/ML  SOLN
50.0000 mL | Freq: Once | INTRAMUSCULAR | Status: AC | PRN
  Administered 2019-06-20: 15 mL via INTRAVENOUS

## 2019-06-20 MED ORDER — FENTANYL CITRATE (PF) 100 MCG/2ML IJ SOLN
INTRAMUSCULAR | Status: AC
  Filled 2019-06-20: qty 2

## 2019-06-20 MED ORDER — FENTANYL CITRATE (PF) 100 MCG/2ML IJ SOLN
INTRAMUSCULAR | Status: AC | PRN
  Administered 2019-06-20 (×2): 25 ug via INTRAVENOUS

## 2019-06-20 MED ORDER — HEPARIN SODIUM (PORCINE) 1000 UNIT/ML IJ SOLN
INTRAMUSCULAR | Status: AC
  Filled 2019-06-20: qty 1

## 2019-06-20 MED ORDER — MIDAZOLAM HCL 2 MG/2ML IJ SOLN
INTRAMUSCULAR | Status: AC
  Filled 2019-06-20: qty 2

## 2019-06-20 NOTE — Sedation Documentation (Signed)
Report given to janice ss rn

## 2019-06-20 NOTE — Procedures (Signed)
Interventional Radiology Procedure Note  Procedure: Right jugular venogram; left tunneled HD catheter exchange  Complications: None  Estimated Blood Loss: < 10 mL  Findings: Right IJ punctured, accessed under [place]. 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  Left IJ HD catheter exchanged over guidewire for new 28 cm tip to cuff length Palindrome catheter. Tip in RA.  Venetia Night. Kathlene Cote, M.D Pager:  [phone]

## 2019-06-20 NOTE — H&P (Signed)
Chief Complaint: Patient was seen in consultation today for non-functioning HD catheter.   Referring Physician(s): Dwana Melena  Supervising Physician: Aletta Edouard  Patient Status: [facility] - Out-pt  History of Present Illness: Brandon Lowery is a 54 y.o. male with past medical history of asthma, DM, HTN, and chronic renal insufficiency who presents to [facility] with non-functioning dialysis catheter.  He reports he has had catheters placed on the right, as well as an AV fistula which is no longer used, and recently transitioned to catheter exchanges on the left.  This catheter was recently exchanged last Thursday.  He does daily dialysis at home and was able to connect Friday, Saturday, but not Sunday.  He then presented to his dialysis center Monday but was unable to undergo dialysis. He presents today for exchange vs. New placement.  He is on coumadin, reportedly to keep his dialysis catheter was clotting.  He last took on Wednesday.   Past Medical History:  Diagnosis Date  . Abscess 09/2009   H/O abdominal wall MRSA abscess. S/P I&D (09/2009)  . Asthma DX: 2009  . Chronic renal insufficiency    Baseline Cr 1.6-2  . DM (diabetes mellitus), type 2 with renal complications DX: [id number]   Uncontrolled.  Marland Kitchen HTN (hypertension)   . Morbid obesity   . Proteinuria     Past Surgical History:  Procedure Laterality Date  . HERNIA REPAIR  1985  . I&D of abdominal wall abscess  09/2009   By Dr. Ninfa Linden    Allergies: Patient has no known allergies.  Medications: Prior to Admission medications   Medication Sig Start Date End Date Taking? Authorizing Provider  amoxicillin-clavulanate (AUGMENTIN) 500-125 MG tablet Take 1 tablet (500 mg total) by mouth daily. Take after Hemodialysis. Can take daily otherwise until gone. 01/19/18  Yes Donita Brooks, NP  aspirin EC 81 MG tablet Take 81 mg by mouth daily.   Yes [provider]  cinacalcet (SENSIPAR) 30 MG tablet Take 2 tablets (60 mg  total) by mouth daily with breakfast. 01/19/18  Yes Ollis, Brandi L, NP  gabapentin (NEURONTIN) 600 MG tablet Take 600-1,200 mg by mouth See admin instructions. 600mg  in the morning and at noon, then take 1200mg  at bedtime   Yes [provider]  midodrine (PROAMATINE) 10 MG tablet Take 1 tablet (10 mg total) by mouth 2 times daily at 12 noon and 4 pm. 01/19/18  Yes Ollis, Brandi L, NP  multivitamin (RENA-VIT) TABS tablet TAKE 1 TABLET BY MOUTH EVERYDAY AT BEDTIME 02/11/18  Yes Ollis, Brandi L, NP  traZODone (DESYREL) 100 MG tablet Take 100 mg by mouth at bedtime.   Yes [provider]  warfarin (COUMADIN) 5 MG tablet Take 5 mg by mouth daily.   Yes [provider]  apixaban (ELIQUIS) 2.5 MG TABS tablet Take 1 tablet (2.5 mg total) by mouth 2 (two) times daily. 01/19/18   Donita Brooks, NP  glimepiride (AMARYL) 4 MG tablet Take 1 tablet (4 mg total) by mouth daily before breakfast. Patient not taking: Reported on 01/16/2018 02/01/11   Alycia Rossetti, MD     Family History  Problem Relation Age of Onset  . Cancer Mother 65       breast cancer  . Asthma Father   . Hypertension Father     Social History   Socioeconomic History  . Marital status: Married    Spouse name: Not on file  . Number of children: Not on file  .  Years of education: Not on file  . Highest education level: Not on file  Occupational History  . 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 Smoking status: Never Smoker  Substance and Sexual Activity  . Alcohol use: No  . Drug use: No  . Sexual activity: Not on file  Other Topics Concern  . Not on file  Social History Narrative   Lives in [place], [place] with wife and 2 sons.   Social Determinants of Health   Financial Resource Strain:   . Difficulty of Paying Living Expenses: Not on file  Food Insecurity:   . Worried About Charity fundraiser in the Last Year: Not on file  . Ran Out of Food in  the Last Year: Not on file  Transportation Needs:   . Lack of Transportation (Medical): Not on file  . Lack of Transportation (Non-Medical): Not on file  Physical Activity:   . Days of Exercise per Week: Not on file  . Minutes of Exercise per Session: Not on file  Stress:   . Feeling of Stress : Not on file  Social Connections:   . Frequency of Communication with Friends and Family: Not on file  . Frequency of Social Gatherings with Friends and Family: Not on file  . Attends Religious Services: Not on file  . Active Member of Clubs or Organizations: Not on file  . Attends Archivist Meetings: Not on file  . Marital Status: Not on file     Review of Systems: A 12 point ROS discussed and pertinent positives are indicated in the HPI above.  All other systems are negative.  Review of Systems  Constitutional: Negative for fatigue and fever.  Respiratory: Negative for cough and shortness of breath.   Cardiovascular: Negative for chest pain.  Gastrointestinal: Negative for abdominal pain.  Musculoskeletal: Negative for back pain.  Psychiatric/Behavioral: Negative for behavioral problems and confusion.    Vital Signs: BP (!) 94/45   Pulse 84   Temp 98 F (36.7 C) (Skin)   Resp 18   Ht 5' 10.5" (1.791 m)   Wt 218 lb (98.9 kg)   SpO2 100%   BMI 30.84 kg/m   Physical Exam Vitals and nursing note reviewed.  Constitutional:      Appearance: Normal appearance.  HENT:     Mouth/Throat:     Mouth: Mucous membranes are moist.     Pharynx: Oropharynx is clear.  Cardiovascular:     Rate and Rhythm: Normal rate and regular rhythm.     Heart sounds: Murmur present.  Pulmonary:     Effort: Pulmonary effort is normal.     Breath sounds: Normal breath sounds.  Abdominal:     General: Abdomen is flat.     Palpations: Abdomen is soft.  Skin:    General: Skin is warm and dry.  Neurological:     General: No focal deficit present.     Mental Status: He is alert and  oriented to person, place, and time. Mental status is at baseline.  Psychiatric:        Mood and Affect: Mood normal.        Behavior: Behavior normal.        Thought Content: Thought content normal.        Judgment: Judgment normal.      MD Evaluation Airway: WNL Heart: WNL Abdomen: WNL Chest/ Lungs: WNL ASA  Classification: 3 Mallampati/Airway Score: One  Imaging: No results found.  Labs:  CBC: No results for input(s): WBC, HGB, HCT, PLT in the last 8760 hours.  COAGS: No results for input(s): INR, APTT in the last 8760 hours.  BMP: No results for input(s): NA, K, CL, CO2, GLUCOSE, BUN, CALCIUM, CREATININE, GFRNONAA, GFRAA in the last 8760 hours.  Invalid input(s): CMP  LIVER FUNCTION TESTS: No results for input(s): BILITOT, AST, ALT, ALKPHOS, PROT, ALBUMIN in the last 8760 hours.  TUMOR MARKERS: No results for input(s): AFPTM, CEA, CA199, CHROMGRNA in the last 8760 hours.  Assessment and Plan: Patient with past medical history of ESRD presents with complaint of non-functioning dialysis catheter.  IR consulted for dialysis catheter exchange at the request of Dr. Augustin Coupe. Case reviewed by Dr. Kathlene Cote who approves patient for procedure.  Patient presents today in their usual state of health.  He has been NPO and is last took coumadin on Wednesday. He has not dialyzed since Monday.  Also, noted to have a heart murmur on physical exam.  Reports he follows with a cardiologist for this.   Risks and benefits discussed with the patient including, but not limited to bleeding, infection, vascular injury, pneumothorax which may require chest tube placement, air embolism or even death  All of the patient's questions were answered, patient is agreeable to proceed. Consent signed and in chart.  Thank you for this interesting consult.  I greatly enjoyed meeting Brandon Lowery and look forward to participating in their care.  A copy of this report was sent to the requesting  provider on this date.  Electronically Signed: Docia Barrier, PA 06/20/2019, 11:06 AM   I spent a total of  30 Minutes   in face to face in clinical consultation, greater than 50% of which was counseling/coordinating care for non-functioning dialysis catheter.

## 2019-06-20 NOTE — Sedation Documentation (Signed)
Vital signs stable. 

## 2019-06-20 NOTE — Sedation Documentation (Signed)
Pt grimacing 

## 2019-06-20 NOTE — Sedation Documentation (Signed)
bandaid applied to right neck

## 2019-06-20 NOTE — Sedation Documentation (Signed)
Patient is resting comfortably. 

## 2019-06-20 NOTE — Sedation Documentation (Signed)
Prepping left hd catheter for exchange at this time. Pt is able to arouse and follow directions, no complaints at this time.

## 2019-06-20 NOTE — Sedation Documentation (Signed)
Patient is resting comfortably. No complaints at this time, procedure started 

## 2019-06-20 NOTE — Discharge Instructions (Addendum)
Central Line Dialysis Access Placement, Care After This sheet gives you information about how to care for yourself after your procedure. Your health care provider may also give you more specific instructions. If you have problems or questions, contact your health care provider. What can I expect after the procedure? After the procedure, it is common to have:  Mild pain or discomfort.  Mild redness, swelling, or bruising around your incision.  A small amount of blood or clear fluid coming from your incision. Follow these instructions at home: Incision care   Follow instructions from your health care provider about how to take care of your incision. Make sure you: ? Wash your hands with soap and water before you change your bandage (dressing). If soap and water are not available, use hand sanitizer. ? Change your dressing as told by your health care provider. ? Leave stitches (sutures) in place.  Check your incision area every day for signs of infection. Check for: ? More redness, swelling, or pain. ? More fluid or blood. ? Warmth. ? Pus or a bad smell.  If directed, put heat on the catheter site as often as told by your health care provider. Use the heat source that your health care provider recommends, such as a moist heat pack or a heating pad. ? Place a towel between your skin and the heat source. ? Leave the heat on for 20-30 minutes. ? Remove the heat if your skin turns bright red. This is especially important if you are unable to feel pain, heat, or cold. You may have a greater risk of getting burned.  If directed, put ice on the catheter site: ? Put ice in a plastic bag. ? Place a towel between your skin and the bag. ? Leave the ice on for 20 minutes, 2-3 times a day. Medicines  Take over-the-counter and prescription medicines only as told by your health care provider.  If you were prescribed an antibiotic medicine, use it as told by your health care provider. Do not stop  using the antibiotic even if you start to feel better. Activity  Return to your normal activities as told by your health care provider. Ask your health care provider what activities are safe for you.  Do not lift anything that is heavier than 10 lb (4.5 kg) until your health care provider says that this is safe. Driving  Do not drive for 24 hours if you were given a medicine to help you relax (sedative) during your procedure.  Do not drive or use heavy machinery while taking prescription pain medicine. Lifestyle  Limit alcohol intake to no more than 1 drink a day for nonpregnant women and 2 drinks a day for men. One drink equals 12 oz of beer, 5 oz of wine, or 1 oz of hard liquor.  Do not use any products that contain nicotine or tobacco, such as cigarettes and e-cigarettes. If you need help quitting, ask your health care provider. General instructions  Do not take baths or showers, swim, or use a hot tub until your health care provider approves. You may only be allowed to take sponge baths for bathing.  Wear compression stockings as told by your health care provider. These stockings help to prevent blood clots and reduce swelling in your legs.  Follow instructions from your health care provider about eating or drinking restrictions.  Keep all follow-up visits as told by your health care provider. This is important. Contact a health care provider if:  Your  catheter gets pulled out of place.  Your catheter site becomes itchy.  You develop a rash around your catheter site.  You have more redness, swelling, or pain around your incision.  You have more fluid or blood coming from your incision.  Your incision area feels warm to the touch.  You have pus or a bad smell coming from your incision.  You have a fever. Get help right away if:  You become light-headed or dizzy.  You faint.  You have difficulty breathing.  Your catheter gets pulled out completely. This  information is not intended to replace advice given to you by your health care provider. Make sure you discuss any questions you have with your health care provider. Document Revised: 04/06/2017 Document Reviewed: 01/17/2016 Elsevier Patient Education  [place]. Moderate Conscious Sedation, Adult Sedation is the use of medicines to promote relaxation and relieve discomfort and anxiety. Moderate conscious sedation is a type of sedation. Under moderate conscious sedation, you are less alert than normal, but you are still able to respond to instructions, touch, or both. Moderate conscious sedation is used during short medical and dental procedures. It is milder than deep sedation, which is a type of sedation under which you cannot be easily woken up. It is also milder than general anesthesia, which is the use of medicines to make you unconscious. Moderate conscious sedation allows you to return to your regular activities sooner. Tell a health care provider about:  Any allergies you have.  All medicines you are taking, including vitamins, herbs, eye drops, creams, and over-the-counter medicines.  Use of steroids (by mouth or creams).  Any problems you or family members have had with sedatives and anesthetic medicines.  Any blood disorders you have.  Any surgeries you have had.  Any medical conditions you have, such as sleep apnea.  Whether you are pregnant or may be pregnant.  Any use of cigarettes, alcohol, marijuana, or street drugs. What are the risks? Generally, this is a safe procedure. However, problems may occur, including:  Getting too much medicine (oversedation).  Nausea.  Allergic reaction to medicines.  Trouble breathing. If this happens, a breathing tube may be used to help with breathing. It will be removed when you are awake and breathing on your own.  Heart trouble.  Lung trouble. What happens before the procedure? Staying hydrated Follow instructions  from your health care provider about hydration, which may include:  Up to 2 hours before the procedure - you may continue to drink clear liquids, such as water, clear fruit juice, black coffee, and plain tea. Eating and drinking restrictions Follow instructions from your health care provider about eating and drinking, which may include:  8 hours before the procedure - stop eating heavy meals or foods such as meat, fried foods, or fatty foods.  6 hours before the procedure - stop eating light meals or foods, such as toast or cereal.  6 hours before the procedure - stop drinking milk or drinks that contain milk.  2 hours before the procedure - stop drinking clear liquids. Medicine Ask your health care provider about:  Changing or stopping your regular medicines. This is especially important if you are taking diabetes medicines or blood thinners.  Taking medicines such as aspirin and ibuprofen. These medicines can thin your blood. Do not take these medicines before your procedure if your health care provider instructs you not to.  Tests and exams  You will have a physical exam.  You may  have blood tests done to show: ? How well your kidneys and liver are working. ? How well your blood can clot. General instructions  Plan to have someone take you home from the hospital or clinic.  If you will be going home right after the procedure, plan to have someone with you for 24 hours. What happens during the procedure?  An IV tube will be inserted into one of your veins.  Medicine to help you relax (sedative) will be given through the IV tube.  The medical or dental procedure will be performed. What happens after the procedure?  Your blood pressure, heart rate, breathing rate, and blood oxygen level will be monitored often until the medicines you were given have worn off.  Do not drive for 24 hours. This information is not intended to replace advice given to you by your health care  provider. Make sure you discuss any questions you have with your health care provider. Document Revised: 04/06/2017 Document Reviewed: 08/14/2015 Elsevier Patient Education  2020 [employer].

## 2019-06-20 NOTE — Sedation Documentation (Signed)
Pt in IR 1. On table being prepped for procedure. Pt vitals are stable at this time. Alert and oriented x4,  Consent signed, pt is NPO.

## 2019-06-20 NOTE — Sedation Documentation (Signed)
Pt  tolerated procedure well.

## 2021-11-28 IMAGING — US IR [PERSON_NAME]/JUGULAR*R*
1 series · 2 of 2 positions shown · non-contrast
Comparison: none

CLINICAL DATA: Malfunctioning left jugular tunneled hemodialysis
catheter despite recent catheter exchange as an outpatient 1 week
ago. History of multiple bilateral tunnel catheter placements.

[Series 1: ir (person_name)/jugular*right* · 2 of 2 slices shown]
[im 1/2]
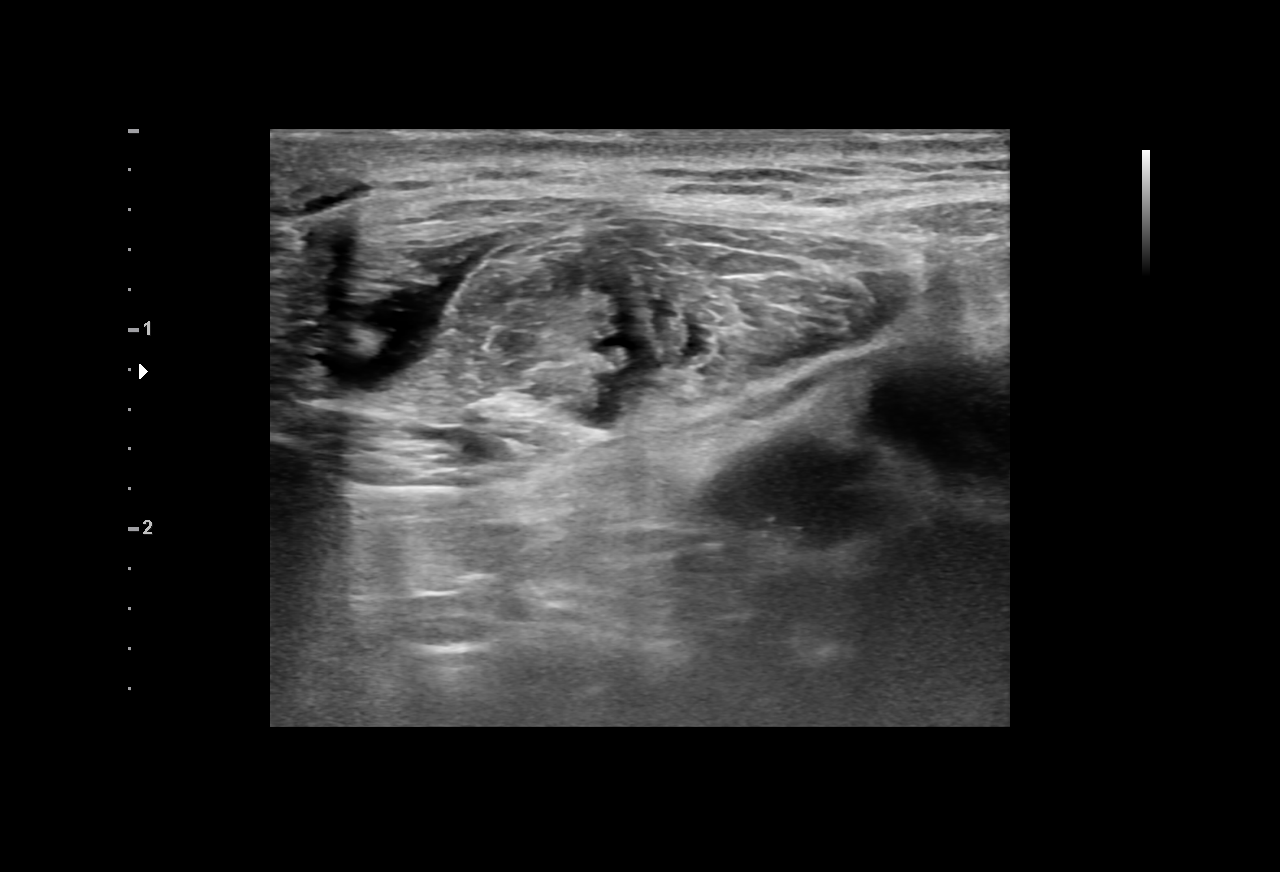
[im 2/2]
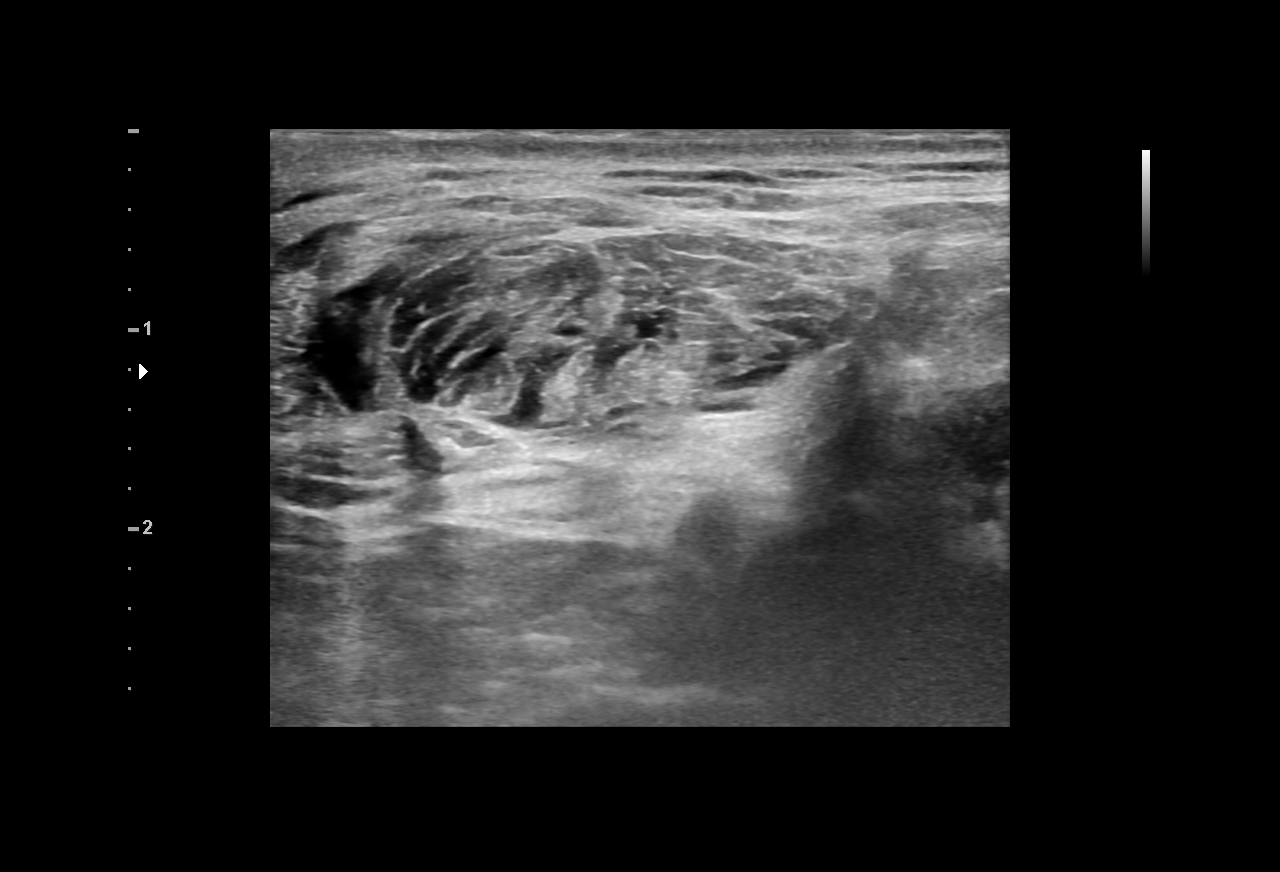

[2 of 2 positions shown; findings below may reference images not displayed]

EXAM:
1. ULTRASOUND GUIDANCE FOR VASCULAR ACCESS OF THE RIGHT INTERNAL
JUGULAR VEIN
2. RIGHT INTERNAL JUGULAR VENOGRAPHY
3. EXCHANGE OF TUNNELED LEFT JUGULAR CENTRAL VENOUS HEMODIALYSIS
CATHETER UNDER FLUOROSCOPIC GUIDANCE

ANESTHESIA/SEDATION:
1.5 mg IV Versed; 50 mcg IV Fentanyl.

Total Moderate Sedation Time

40 minutes.

The patient's level of consciousness and physiologic status were
continuously monitored during the procedure by Radiology nursing.

MEDICATIONS:
2 g IV Ancef. IV antibiotic was given in a appropriate time interval
prior to skin puncture.

FLUOROSCOPY TIME:  4 minutes and 54 seconds.  57.9 mGy.

CONTRAST:  15 mL Omnipaque 300

PROCEDURE:
The procedure, risks, benefits, and alternatives were explained to
the patient. Questions regarding the procedure were encouraged and
answered. The patient understands and consents to the procedure. A
time-out was performed prior to initiating the procedure.

Ultrasound was utilized to confirm patency of the right internal
jugular vein. Under ultrasound guidance, access of the vein was
performed with a 21 gauge needle and micropuncture set. Ultrasound
image documentation was performed. Attempt was made to pass small
caliber guidewires into the central veins. A venogram was performed
via the micropuncture dilator positioned at the base of the right
internal jugular vein at the level of a collateral vein extending
into the chest. Digital subtraction angiography was performed
through the micropuncture dilator. The dilator was then removed.

The preexisting left jugular tunneled dialysis catheter and
surrounding skin were prepped with chlorhexidine in a sterile
fashion, and a sterile drape was applied covering the operative
field. Maximum barrier sterile technique with sterile gowns and
gloves were used for the procedure. Local anesthesia was provided
with 1% lidocaine.

Stiff hydrophilic wires were passed through both lumens of the
pre-existing catheter. The preexisting dialysis catheter was removed
over a guidewire after freeing the subcutaneous cuff using blunt
dissection.

A new Palindrome tunneled hemodialysis catheter measuring 28 cm from
tip to cuff was chosen for placement. This was advanced over the
guidewire under fluoroscopy. Final catheter positioning was
confirmed and documented with a fluoroscopic spot image. The
catheter was aspirated, flushed with saline, and injected with
appropriate volume heparin dwells.

COMPLICATIONS:
None.  No pneumothorax.
FINDINGS: Based on malfunctioning of the left-sided dialysis catheter despite
recent exchange, decision was made to determine whether a
right-sided tunneled dialysis catheter could be placed today. The
right internal jugular vein is patent in the neck. After access of
the vein and guidewire passage, it became evident that a guidewire
would not pass completely to the expected position of the SVC.
Venography through a micropuncture dilator demonstrates chronic
occlusion of the right brachiocephalic vein with chest wall and neck
collateral veins present. With right-sided injection there is no
significant reconstitution of the SVC. Attempt at right-sided
dialysis catheter placement was therefore abandoned.

The left jugular tunneled dialysis catheter was exchanged. Prior
catheter exchange, multiple guidewire passages were performed
through both lumens in attempt to try to break up presume fibrin
sheath material at the tip of the catheter. After catheter exchange,
the new catheter tip lies in the right atrium. The new catheter was
placed slightly further into the right atrium than the pre-existing
catheter and its tip appears to be moving more freely in the right
atrium with cardiac motion than the previous catheter. The catheter
aspirates normally and is ready for immediate use.
IMPRESSION: 1. Inability to place a new tunneled dialysis catheter via the right
internal jugular vein due to chronic occlusion of the right
brachiocephalic vein confirmed by contrast venography after right
internal jugular venous access.
2. Exchange of the tunneled hemodialysis catheter via the left
internal jugular vein was performed. The catheter tip lies in the
right atrium. The catheter is ready for immediate use.

## 2023-10-07 DEATH — deceased
